# Patient Record
Sex: Female | Born: 1960 | Race: White | Hispanic: No | Smoking: Former smoker
Health system: Southern US, Community
[De-identification: ages and names within clinical notes are randomized; demographics above are authoritative.]

## PROBLEM LIST (undated history)

## (undated) DIAGNOSIS — I10 Essential (primary) hypertension: Secondary | ICD-10-CM

## (undated) DIAGNOSIS — E785 Hyperlipidemia, unspecified: Secondary | ICD-10-CM

## (undated) DIAGNOSIS — J309 Allergic rhinitis, unspecified: Secondary | ICD-10-CM

## (undated) DIAGNOSIS — B20 Human immunodeficiency virus [HIV] disease: Secondary | ICD-10-CM

## (undated) HISTORY — DX: Hyperlipidemia, unspecified: E78.5

## (undated) HISTORY — DX: Human immunodeficiency virus (HIV) disease: B20

## (undated) HISTORY — DX: Allergic rhinitis, unspecified: J30.9

## (undated) HISTORY — DX: Essential (primary) hypertension: I10

---

## 1994-02-28 HISTORY — PX: INCISION AND DRAINAGE PERIRECTAL ABSCESS: SHX1804

## 2002-06-29 HISTORY — PX: CHOLECYSTECTOMY: SHX55

## 2002-11-26 ENCOUNTER — Emergency Department (HOSPITAL_COMMUNITY): Admission: EM | Admit: 2002-11-26 | Discharge: 2002-11-26 | Payer: Self-pay | Admitting: Emergency Medicine

## 2002-11-26 ENCOUNTER — Encounter: Payer: Self-pay | Admitting: Emergency Medicine

## 2004-04-21 ENCOUNTER — Ambulatory Visit (HOSPITAL_COMMUNITY): Admission: RE | Admit: 2004-04-21 | Discharge: 2004-04-21 | Payer: Self-pay | Admitting: Internal Medicine

## 2004-04-21 ENCOUNTER — Encounter (INDEPENDENT_AMBULATORY_CARE_PROVIDER_SITE_OTHER): Payer: Self-pay | Admitting: *Deleted

## 2004-04-21 ENCOUNTER — Ambulatory Visit: Payer: Self-pay | Admitting: Internal Medicine

## 2004-05-18 ENCOUNTER — Ambulatory Visit: Payer: Self-pay | Admitting: Internal Medicine

## 2004-06-29 ENCOUNTER — Ambulatory Visit (HOSPITAL_COMMUNITY): Admission: RE | Admit: 2004-06-29 | Discharge: 2004-06-29 | Payer: Self-pay | Admitting: Internal Medicine

## 2004-06-29 ENCOUNTER — Ambulatory Visit: Payer: Self-pay | Admitting: Internal Medicine

## 2004-07-20 ENCOUNTER — Ambulatory Visit: Payer: Self-pay | Admitting: Internal Medicine

## 2004-10-19 ENCOUNTER — Ambulatory Visit: Payer: Self-pay | Admitting: Internal Medicine

## 2004-10-19 ENCOUNTER — Ambulatory Visit (HOSPITAL_COMMUNITY): Admission: RE | Admit: 2004-10-19 | Discharge: 2004-10-19 | Payer: Self-pay | Admitting: Internal Medicine

## 2005-02-08 ENCOUNTER — Ambulatory Visit: Payer: Self-pay | Admitting: Internal Medicine

## 2005-04-27 ENCOUNTER — Encounter (INDEPENDENT_AMBULATORY_CARE_PROVIDER_SITE_OTHER): Payer: Self-pay | Admitting: *Deleted

## 2005-04-27 ENCOUNTER — Encounter: Admission: RE | Admit: 2005-04-27 | Discharge: 2005-04-27 | Payer: Self-pay | Admitting: Internal Medicine

## 2005-04-27 ENCOUNTER — Ambulatory Visit: Payer: Self-pay | Admitting: Internal Medicine

## 2005-04-27 LAB — CONVERTED CEMR LAB: HIV 1 RNA Quant: 399 copies/mL

## 2005-07-12 ENCOUNTER — Encounter: Admission: RE | Admit: 2005-07-12 | Discharge: 2005-07-12 | Payer: Self-pay | Admitting: Internal Medicine

## 2005-07-12 ENCOUNTER — Encounter (INDEPENDENT_AMBULATORY_CARE_PROVIDER_SITE_OTHER): Payer: Self-pay | Admitting: *Deleted

## 2005-07-12 ENCOUNTER — Ambulatory Visit: Payer: Self-pay | Admitting: Internal Medicine

## 2005-07-12 LAB — CONVERTED CEMR LAB: CD4 Count: 180 microliters

## 2005-07-22 ENCOUNTER — Ambulatory Visit: Payer: Self-pay | Admitting: Gynecology

## 2005-08-12 ENCOUNTER — Ambulatory Visit (HOSPITAL_COMMUNITY): Admission: RE | Admit: 2005-08-12 | Discharge: 2005-08-12 | Payer: Self-pay | Admitting: Internal Medicine

## 2005-08-12 ENCOUNTER — Ambulatory Visit: Payer: Self-pay | Admitting: Family Medicine

## 2005-08-12 ENCOUNTER — Encounter: Payer: Self-pay | Admitting: Family Medicine

## 2005-08-12 ENCOUNTER — Encounter: Payer: Self-pay | Admitting: Internal Medicine

## 2005-12-31 DIAGNOSIS — J309 Allergic rhinitis, unspecified: Secondary | ICD-10-CM | POA: Insufficient documentation

## 2005-12-31 DIAGNOSIS — E785 Hyperlipidemia, unspecified: Secondary | ICD-10-CM

## 2005-12-31 DIAGNOSIS — Z21 Asymptomatic human immunodeficiency virus [HIV] infection status: Secondary | ICD-10-CM | POA: Insufficient documentation

## 2005-12-31 DIAGNOSIS — D649 Anemia, unspecified: Secondary | ICD-10-CM | POA: Insufficient documentation

## 2005-12-31 DIAGNOSIS — E781 Pure hyperglyceridemia: Secondary | ICD-10-CM | POA: Insufficient documentation

## 2005-12-31 DIAGNOSIS — B20 Human immunodeficiency virus [HIV] disease: Secondary | ICD-10-CM

## 2005-12-31 DIAGNOSIS — I10 Essential (primary) hypertension: Secondary | ICD-10-CM | POA: Insufficient documentation

## 2005-12-31 DIAGNOSIS — B360 Pityriasis versicolor: Secondary | ICD-10-CM | POA: Insufficient documentation

## 2005-12-31 HISTORY — DX: Human immunodeficiency virus (HIV) disease: B20

## 2005-12-31 HISTORY — DX: Allergic rhinitis, unspecified: J30.9

## 2005-12-31 HISTORY — DX: Hyperlipidemia, unspecified: E78.5

## 2005-12-31 HISTORY — DX: Essential (primary) hypertension: I10

## 2006-02-14 ENCOUNTER — Encounter: Admission: RE | Admit: 2006-02-14 | Discharge: 2006-02-14 | Payer: Self-pay | Admitting: Internal Medicine

## 2006-02-14 ENCOUNTER — Ambulatory Visit: Payer: Self-pay | Admitting: Internal Medicine

## 2006-02-14 ENCOUNTER — Encounter (INDEPENDENT_AMBULATORY_CARE_PROVIDER_SITE_OTHER): Payer: Self-pay | Admitting: *Deleted

## 2006-02-14 LAB — CONVERTED CEMR LAB
CD4 Count: 210 microliters
HIV 1 RNA Quant: 97 copies/mL

## 2006-04-24 ENCOUNTER — Encounter (INDEPENDENT_AMBULATORY_CARE_PROVIDER_SITE_OTHER): Payer: Self-pay | Admitting: *Deleted

## 2006-04-24 LAB — CONVERTED CEMR LAB

## 2006-05-07 ENCOUNTER — Encounter (INDEPENDENT_AMBULATORY_CARE_PROVIDER_SITE_OTHER): Payer: Self-pay | Admitting: *Deleted

## 2006-07-05 ENCOUNTER — Telehealth: Payer: Self-pay | Admitting: Internal Medicine

## 2006-09-04 ENCOUNTER — Encounter (INDEPENDENT_AMBULATORY_CARE_PROVIDER_SITE_OTHER): Payer: Self-pay | Admitting: *Deleted

## 2006-10-02 ENCOUNTER — Encounter (INDEPENDENT_AMBULATORY_CARE_PROVIDER_SITE_OTHER): Payer: Self-pay | Admitting: *Deleted

## 2006-10-03 ENCOUNTER — Encounter (INDEPENDENT_AMBULATORY_CARE_PROVIDER_SITE_OTHER): Payer: Self-pay | Admitting: *Deleted

## 2006-10-03 ENCOUNTER — Encounter: Admission: RE | Admit: 2006-10-03 | Discharge: 2006-10-03 | Payer: Self-pay | Admitting: Internal Medicine

## 2006-10-03 ENCOUNTER — Ambulatory Visit: Payer: Self-pay | Admitting: Internal Medicine

## 2006-10-03 LAB — CONVERTED CEMR LAB
ALT: 12 units/L (ref 0–35)
Albumin: 4.5 g/dL (ref 3.5–5.2)
Alkaline Phosphatase: 64 units/L (ref 39–117)
Glucose, Bld: 74 mg/dL (ref 70–99)
HIV 1 RNA Quant: 294 copies/mL — ABNORMAL HIGH (ref <50)
HIV-1 RNA Quant, Log: 2.47 — ABNORMAL HIGH (ref <1.70)
MCHC: 33.3 g/dL (ref 30.0–36.0)
MCV: 110.9 fL — ABNORMAL HIGH (ref 78.0–100.0)
Platelets: 383 10*3/uL (ref 150–400)
Potassium: 5.2 meq/L (ref 3.5–5.3)
RBC: 3.57 M/uL — ABNORMAL LOW (ref 3.87–5.11)
Sodium: 135 meq/L (ref 135–145)
Total Bilirubin: 0.6 mg/dL (ref 0.3–1.2)
Total Protein: 7.6 g/dL (ref 6.0–8.3)
WBC: 7 10*3/uL (ref 4.0–10.5)

## 2006-11-09 ENCOUNTER — Telehealth: Payer: Self-pay | Admitting: Internal Medicine

## 2007-03-12 ENCOUNTER — Encounter: Payer: Self-pay | Admitting: Internal Medicine

## 2007-04-03 ENCOUNTER — Encounter (INDEPENDENT_AMBULATORY_CARE_PROVIDER_SITE_OTHER): Payer: Self-pay | Admitting: *Deleted

## 2007-04-03 ENCOUNTER — Ambulatory Visit: Payer: Self-pay | Admitting: Internal Medicine

## 2007-04-03 ENCOUNTER — Encounter: Admission: RE | Admit: 2007-04-03 | Discharge: 2007-04-03 | Payer: Self-pay | Admitting: Internal Medicine

## 2007-04-03 LAB — CONVERTED CEMR LAB
ALT: 16 units/L (ref 0–35)
AST: 17 units/L (ref 0–37)
Albumin: 4.3 g/dL (ref 3.5–5.2)
CO2: 21 meq/L (ref 19–32)
Calcium: 8.8 mg/dL (ref 8.4–10.5)
Chloride: 101 meq/L (ref 96–112)
Cholesterol: 222 mg/dL — ABNORMAL HIGH (ref 0–200)
Creatinine, Ser: 0.7 mg/dL (ref 0.40–1.20)
HIV 1 RNA Quant: 633 copies/mL — ABNORMAL HIGH (ref <50)
HIV-1 RNA Quant, Log: 2.8 — ABNORMAL HIGH (ref <1.70)
Platelets: 354 10*3/uL (ref 150–400)
Potassium: 4.3 meq/L (ref 3.5–5.3)
RDW: 14.3 % (ref 11.5–15.5)
Sodium: 135 meq/L (ref 135–145)
Total CHOL/HDL Ratio: 5.4
Total Protein: 7.2 g/dL (ref 6.0–8.3)
WBC: 9.4 10*3/uL (ref 4.0–10.5)

## 2007-07-09 ENCOUNTER — Telehealth (INDEPENDENT_AMBULATORY_CARE_PROVIDER_SITE_OTHER): Payer: Self-pay | Admitting: *Deleted

## 2007-07-17 ENCOUNTER — Encounter: Payer: Self-pay | Admitting: Internal Medicine

## 2007-10-09 ENCOUNTER — Encounter: Admission: RE | Admit: 2007-10-09 | Discharge: 2007-10-09 | Payer: Self-pay | Admitting: Internal Medicine

## 2007-10-09 ENCOUNTER — Ambulatory Visit: Payer: Self-pay | Admitting: Internal Medicine

## 2007-10-09 ENCOUNTER — Encounter (INDEPENDENT_AMBULATORY_CARE_PROVIDER_SITE_OTHER): Payer: Self-pay | Admitting: *Deleted

## 2007-10-09 LAB — CONVERTED CEMR LAB
AST: 16 units/L (ref 0–37)
Albumin: 4.5 g/dL (ref 3.5–5.2)
Alkaline Phosphatase: 70 units/L (ref 39–117)
Basophils Relative: 0 % (ref 0–1)
Chloride: 101 meq/L (ref 96–112)
Eosinophils Absolute: 0.1 10*3/uL (ref 0.0–0.7)
Glucose, Bld: 85 mg/dL (ref 70–99)
Lymphs Abs: 2.8 10*3/uL (ref 0.7–4.0)
MCV: 108.6 fL — ABNORMAL HIGH (ref 78.0–100.0)
Neutro Abs: 3.8 10*3/uL (ref 1.7–7.7)
Neutrophils Relative %: 52 % (ref 43–77)
Platelets: 378 10*3/uL (ref 150–400)
Potassium: 4.4 meq/L (ref 3.5–5.3)
Sodium: 136 meq/L (ref 135–145)
Total Protein: 7.7 g/dL (ref 6.0–8.3)
WBC: 7.3 10*3/uL (ref 4.0–10.5)

## 2007-10-19 ENCOUNTER — Telehealth: Payer: Self-pay | Admitting: Internal Medicine

## 2007-12-05 ENCOUNTER — Ambulatory Visit: Payer: Self-pay | Admitting: Internal Medicine

## 2008-02-29 DIAGNOSIS — E559 Vitamin D deficiency, unspecified: Secondary | ICD-10-CM

## 2008-02-29 HISTORY — DX: Vitamin D deficiency, unspecified: E55.9

## 2008-05-06 ENCOUNTER — Encounter: Payer: Self-pay | Admitting: Internal Medicine

## 2008-05-20 ENCOUNTER — Telehealth (INDEPENDENT_AMBULATORY_CARE_PROVIDER_SITE_OTHER): Payer: Self-pay | Admitting: *Deleted

## 2008-06-23 ENCOUNTER — Encounter: Payer: Self-pay | Admitting: Internal Medicine

## 2008-07-11 ENCOUNTER — Encounter (INDEPENDENT_AMBULATORY_CARE_PROVIDER_SITE_OTHER): Payer: Self-pay | Admitting: *Deleted

## 2008-07-31 ENCOUNTER — Ambulatory Visit: Payer: Self-pay | Admitting: Internal Medicine

## 2008-07-31 DIAGNOSIS — E881 Lipodystrophy, not elsewhere classified: Secondary | ICD-10-CM

## 2008-07-31 DIAGNOSIS — R197 Diarrhea, unspecified: Secondary | ICD-10-CM

## 2008-07-31 LAB — CONVERTED CEMR LAB
BUN: 8 mg/dL (ref 6–23)
CO2: 20 meq/L (ref 19–32)
Creatinine, Ser: 0.7 mg/dL (ref 0.40–1.20)
GFR calc Af Amer: >60 mL/min (ref >60)
GFR calc non Af Amer: >60 mL/min (ref >60)
Glucose, Bld: 112 mg/dL — ABNORMAL HIGH (ref 70–99)
HDL: 51 mg/dL (ref >39)
HIV 1 RNA Quant: 171 copies/mL — ABNORMAL HIGH (ref <48)
Hemoglobin: 12.7 g/dL (ref 12.0–15.0)
Lymphocytes Relative: 33 % (ref 12–46)
Monocytes Absolute: 0.7 10*3/uL (ref 0.1–1.0)
Monocytes Relative: 14 % — ABNORMAL HIGH (ref 3–12)
Neutro Abs: 2.6 10*3/uL (ref 1.7–7.7)
RBC: 3.46 M/uL — ABNORMAL LOW (ref 3.87–5.11)
RDW: 13.6 % (ref 11.5–15.5)
Total Bilirubin: 0.4 mg/dL (ref 0.3–1.2)
Total CHOL/HDL Ratio: 4.5
WBC: 5 10*3/uL (ref 4.0–10.5)

## 2008-08-01 ENCOUNTER — Telehealth: Payer: Self-pay

## 2008-10-02 ENCOUNTER — Encounter (INDEPENDENT_AMBULATORY_CARE_PROVIDER_SITE_OTHER): Payer: Self-pay | Admitting: *Deleted

## 2008-10-02 ENCOUNTER — Other Ambulatory Visit: Admission: RE | Admit: 2008-10-02 | Discharge: 2008-10-02 | Payer: Self-pay | Admitting: Family Medicine

## 2008-10-02 ENCOUNTER — Encounter: Payer: Self-pay | Admitting: Internal Medicine

## 2008-10-02 LAB — CONVERTED CEMR LAB: Pap Smear: NEGATIVE

## 2008-10-13 ENCOUNTER — Encounter: Admission: RE | Admit: 2008-10-13 | Discharge: 2008-10-13 | Payer: Self-pay | Admitting: Family Medicine

## 2008-10-13 ENCOUNTER — Encounter: Payer: Self-pay | Admitting: Internal Medicine

## 2008-10-29 ENCOUNTER — Encounter: Payer: Self-pay | Admitting: Internal Medicine

## 2008-10-30 ENCOUNTER — Encounter: Payer: Self-pay | Admitting: Internal Medicine

## 2008-10-30 LAB — HM COLONOSCOPY

## 2008-11-05 ENCOUNTER — Encounter: Payer: Self-pay | Admitting: Internal Medicine

## 2008-11-27 ENCOUNTER — Encounter (INDEPENDENT_AMBULATORY_CARE_PROVIDER_SITE_OTHER): Payer: Self-pay | Admitting: *Deleted

## 2009-07-24 ENCOUNTER — Telehealth (INDEPENDENT_AMBULATORY_CARE_PROVIDER_SITE_OTHER): Payer: Self-pay | Admitting: *Deleted

## 2009-08-26 ENCOUNTER — Ambulatory Visit: Payer: Self-pay | Admitting: Internal Medicine

## 2009-08-26 LAB — CONVERTED CEMR LAB
ALT: <8 units/L (ref 0–35)
AST: 11 units/L (ref 0–37)
Alkaline Phosphatase: 90 units/L (ref 39–117)
BUN: 8 mg/dL (ref 6–23)
Basophils Absolute: 0 10*3/uL (ref 0.0–0.1)
Calcium: 9.3 mg/dL (ref 8.4–10.5)
Creatinine, Ser: 0.69 mg/dL (ref 0.40–1.20)
Eosinophils Absolute: 0.2 10*3/uL (ref 0.0–0.7)
Eosinophils Relative: 2 % (ref 0–5)
HCT: 37.2 % (ref 36.0–46.0)
HDL: 43 mg/dL (ref >39)
LDL Cholesterol: 64 mg/dL (ref 0–99)
MCV: 106 fL — ABNORMAL HIGH (ref 78.0–100.0)
Neutrophils Relative %: 64 % (ref 43–77)
Platelets: 471 10*3/uL — ABNORMAL HIGH (ref 150–400)
RDW: 14.4 % (ref 11.5–15.5)
Total Bilirubin: 0.4 mg/dL (ref 0.3–1.2)
Total CHOL/HDL Ratio: 4.3
VLDL: 77 mg/dL — ABNORMAL HIGH (ref 0–40)
WBC: 10.4 10*3/uL (ref 4.0–10.5)

## 2009-09-10 ENCOUNTER — Ambulatory Visit: Payer: Self-pay | Admitting: Internal Medicine

## 2009-09-10 DIAGNOSIS — G5600 Carpal tunnel syndrome, unspecified upper limb: Secondary | ICD-10-CM | POA: Insufficient documentation

## 2009-09-10 DIAGNOSIS — R109 Unspecified abdominal pain: Secondary | ICD-10-CM | POA: Insufficient documentation

## 2009-12-29 ENCOUNTER — Encounter (INDEPENDENT_AMBULATORY_CARE_PROVIDER_SITE_OTHER): Payer: Self-pay | Admitting: *Deleted

## 2010-03-28 LAB — CONVERTED CEMR LAB
ALT: 8 units/L (ref 0–35)
AST: 11 units/L (ref 0–37)
Alkaline Phosphatase: 71 units/L (ref 39–117)
Creatinine, Ser: 0.63 mg/dL (ref 0.40–1.20)
HIV 1 RNA Quant: 97 copies/mL — ABNORMAL HIGH (ref <50)
Total Bilirubin: 0.4 mg/dL (ref 0.3–1.2)

## 2010-03-30 NOTE — Miscellaneous (Signed)
Summary: Orders Update  Clinical Lists Changes  Orders: Added new Test order of T-CBC w/Diff [phone]) - Signed Added new Test order of T-CD4SP [facility]) (CD4SP) - Signed Added new Test order of T-Comprehensive Metabolic Panel [phone]) - Signed Added new Test order of T-HIV Viral Load [phone]) - Signed Added new Test order of T-RPR (Syphilis) ([phone]) - Signed Added new Test order of T-Lipid Profile ([id number]) - Signed     Process Orders Check Orders Results:     [facility]: ABN not required for this insurance Order queued for requisitioning for [facility]: August 26, 2009 9:44 AM  Tests Sent for requisitioning (August 26, 2009 9:44 AM):     08/26/2009: [facility] -- T-CBC w/Diff [[id number]] (signed)     08/26/2009: [facility] -- T-Comprehensive Metabolic Panel [[id number]] (signed)     08/26/2009: [facility] -- T-HIV Viral Load [phone] (signed)     08/26/2009: [facility] -- T-RPR (Syphilis) [phone] (signed)     08/26/2009: [facility] -- T-Lipid Profile [phone] (signed)

## 2010-03-30 NOTE — Assessment & Plan Note (Signed)
Summary: F/U OV/VS   CC:  follow-up visit, left sided rib and lower back and lower abdominal pain, heating pad and bowel movements help with pain, and wakes up in the morning with the pain.  History of Present Illness: Tammy Henson is in for her routine visit.  She says that she has had some difficulty meeting her co-pays for her medications in the last 6 months.  She recalls two times when she had to stop taking her HIV regimen when she could not afford the refills for several days.  She says she is currently making less money.  She has had several new problems since her last visit.  She developed bilateral wrist and hand pain and saw a local neurologist who diagnosed carpal tunnel syndrome.  She was referred to Dr. Merlyn Lot a hand surgeon who suggested surgery but she says she was reluctant and also could not afford it at the time.  She has been wearing a brace on her right wrist and feels that that has helped.  She had a recent episode of left mid back pain and saw a chiropractor who told her that a " rib head was out of alignment" she did feel better after his treatment.   She says that she was given an antibiotic about two months ago at an urgent care center for an upper respiratory infection but developed some diarrhea after two days and stopped taking the antibiotic.  She got better fairly quickly.  About 4 or 5 days ago she noted the onset of lower, no cramping with mild intermittent nausea.  She says that the cramping pain would wake her from sleep.  She went to sit on a heating pad in her living room.  She has had a little bit of diarrhea and finds that after a bowel movement the cramping pain seems to be relieved.  She has had no fever.   She asked if her lisinopril might be the cause of her  in appearance and prominent veins in her arms.  She says friends off and asked her she works out.  Preventive Screening-Counseling & Management  Alcohol-Tobacco     Alcohol drinks/day: 0     Smoking  Status: never     Year Quit: 29 yrs ago     Passive Smoke Exposure: yes  Caffeine-Diet-Exercise     Caffeine use/day: 2     Does Patient Exercise: no  Hep-HIV-STD-Contraception     HIV Risk: no  Safety-Violence-Falls     Seat Belt Use: 100  Comments: declined condoms      Sexual History:  boyfriend.        Drug Use:  never.     Prior Medication List:  ZERIT 40 MG CAPS (STAVUDINE) Take 1 capsule by mouth two times a day VIREAD 300 MG TABS (TENOFOVIR DISOPROXIL FUMARATE) Take 1 tablet by mouth once a day KALETRA 200-50 MG TABS (LOPINAVIR-RITONAVIR) Take 2 tablet by mouth two times a day NASONEX 50 MCG/ACT  SUSP (MOMETASONE FUROATE) 2 puffs in each nostril daily LISINOPRIL 20 MG  TABS (LISINOPRIL) Take 1 tablet by mouth once a day   Current Allergies (reviewed today): No known allergies  Social History: Sexual History:  boyfriend Drug Use:  never  Additional History Menstrual Status:  perimenopausal  Vital Signs:  Patient profile:   50 year old female Menstrual status:  perimenopausal LMP:     09/07/2009 Height:      63 inches (160.02 cm) Weight:      118  pounds (53.64 kg) BMI:     20.98 Temp:     98.1 degrees F (36.72 degrees C) oral Pulse rate:   94 / minute BP sitting:   107 / 71  (left arm) Cuff size:   regular  Vitals Entered By: Jennet Maduro RN (September 10, 2009 9:12 AM) CC: follow-up visit, left sided rib and lower back and lower abdominal pain, heating pad and bowel movements help with pain,  wakes up in the morning with the pain Pain Assessment Patient in pain? yes     Location: see above Intensity: 5 Type: throbbing Onset of pain  Constant Nutritional Status BMI of 19 -24 = normal Nutritional Status Detail appetite "OK" pain causing some nausea  Have you ever been in a relationship where you felt threatened, hurt or afraid?No   Does patient need assistance? Functional Status Self care Ambulation Normal Comments may have missed a few doses  due to money issues LMP (date): 09/07/2009     Menstrual Status perimenopausal Enter LMP: 09/07/2009 Last PAP Result Negative   Physical Exam  General:  alert and thin.  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 Mouth:  good dentition, pharynx pink and moist, no erythema, and no exudates.   Lungs:  normal breath sounds.  no crackles and no wheezes.   Heart:  normal rate, regular rhythm, and no murmur.  normal rate, regular rhythm, and no murmur.   Abdomen:  soft, non-tender, normal bowel sounds, and no distention.   Skin:  no rashes.   Psych:  normally interactive, good eye contact, not anxious appearing, and not depressed appearing.          Medication Adherence: 09/10/2009   Adherence to medications reviewed with patient. Counseling to provide adequate adherence provided                                Impression & Recommendations:  Problem # 1:  HIV DISEASE (ICD-042) Her infection remains under fairly good control but I am concerned about her recent missed medications.  I will have her talk to our medication assistance coordinator today. Diagnostics Reviewed:  CD4: 360 (08/27/2009)   WBC: 10.4 (08/26/2009)   Hgb: 12.1 (08/26/2009)   HCT: 37.2 (08/26/2009)   Platelets: 471 (08/26/2009) HIV-1 RNA: 78 (08/26/2009)   HBSAg: NO (04/24/2006)  Problem # 2:  ABDOMINAL CRAMPS (ICD-789.00) I have asked her to call within the next week if her abdominal cramps and mild diarrhea persist so we can obtain a stool specimen for Clostridium difficile toxin assay. Orders: Physical Therapy Referral (PT)  Problem # 3:  HYPERTENSION (ICD-401.9) Her blood pressure is under very good control.  I told her that her lipoatrophy  is related to her HIV infection and not her blood pressure medication and I encouraged her to continue it. Her updated medication list for this problem includes:    Lisinopril 20 Mg Tabs (Lisinopril) .Marland Kitchen... Take 1 tablet by  mouth once a day  Orders: Est. Patient Level IV (36644)  Problem # 4:  HYPERLIPIDEMIA (ICD-272.4) Her triglyceride level is improving quite a bit.  She is reluctant to take any more medications and wants to try to address it through dietary modification. Labs Reviewed: SGOT: 11 (08/26/2009)   SGPT: <8 U/L (08/26/2009)   HDL:43 (08/26/2009), 51 (07/31/2008)  LDL:64 (08/26/2009), See Comment mg/dL (03/47/4259)  DGLO:756 (08/26/2009), 231 (07/31/2008)  Trig:383 (08/26/2009), 563 (  07/31/2008)  Problem # 5:  CARPAL TUNNEL SYNDROME (ICD-354.0) I will refer her for physical therapy. Orders: Physical Therapy Referral (PT)  Other Orders: Pneumococcal Vaccine (09811) Admin 1st Vaccine (91478) Admin 1st Vaccine [facility]) [phone]) Future Orders: T-CD4SP ([facility]) (CD4SP) ... 03/09/2010 T-HIV Viral Load [phone]) ... 03/09/2010 T-Comprehensive Metabolic Panel [phone]) ... 03/09/2010 T-CBC w/Diff ([id number]) ... 03/09/2010 T-RPR (Syphilis) [phone]) ... 03/09/2010 T-Lipid Profile [phone]) ... 03/09/2010  Patient Instructions: 1)  Please schedule a follow-up appointment in 6 months.    Pneumovax Vaccine    Vaccine Type: Pneumovax    Site: left deltoid    Mfr: Merck    Dose: 0.5 ml    Route: IM    Given by: Jennet Maduro RN    Exp. Date: 02/27/2011    Lot #: [id number]

## 2010-03-30 NOTE — Miscellaneous (Signed)
Summary: RW flowsheet update  Clinical Lists Changes  Observations: Added new observation of ABNLIPIDS: Yes (12/29/2009 18:27) Added new observation of YEARAIDSPOS: 2006  (12/29/2009 18:27) Added new observation of HIV STATUS: CDC-defined AIDS  (12/29/2009 18:27)

## 2010-03-30 NOTE — Miscellaneous (Signed)
Summary: [facility]   Imported By: Florinda Marker 09/11/2009 15:43:21  _____________________________________________________________________  External Attachment:    Type:   Image     Comment:   External Document

## 2010-03-30 NOTE — Progress Notes (Signed)
Summary: Request for 7 day supply. waiting on mail order  Phone Note Call from Patient   Caller: Patient Reason for Call: Refill Medication Details for Reason: Need 7 days supply of med Summary of Call: Patient's mail order did not arrive.  Patient is asking for a 7 days supply of med be called into pharmacy.  Insurance approved. Initial call taken by: Paulo Fruit  BS,CPht II,MPH,  Jul 24, 2009 4:00 PM    Prescriptions: ZERIT 40 MG CAPS (STAVUDINE) Take 1 capsule by mouth two times a day  #14 x 0   Entered by:   Paulo Fruit  BS,CPht II,MPH   Authorized by:   Cliffton Asters MD   Signed by:   Paulo Fruit  BS,CPht II,MPH on 07/24/2009   Method used:   Electronically to        [facility] (retail)       [address]       [place], [place]  [address]       Ph: [phone] or [phone]       Fax: [phone]   RxID:   [id number]  Paulo Fruit  BS,CPht II,MPH  Jul 24, 2009 4:00 PM

## 2010-03-31 ENCOUNTER — Encounter: Payer: Self-pay | Admitting: *Deleted

## 2010-03-31 LAB — CONVERTED CEMR LAB: Pap Smear: NORMAL

## 2010-04-15 ENCOUNTER — Other Ambulatory Visit (HOSPITAL_COMMUNITY)
Admission: RE | Admit: 2010-04-15 | Discharge: 2010-04-15 | Disposition: A | Discharge status: Home / Self Care | Payer: 59 | Source: Ambulatory Visit | Attending: Family Medicine | Admitting: Family Medicine

## 2010-04-15 ENCOUNTER — Ambulatory Visit (INDEPENDENT_AMBULATORY_CARE_PROVIDER_SITE_OTHER): Payer: 59 | Admitting: Family Medicine

## 2010-04-15 ENCOUNTER — Other Ambulatory Visit: Payer: Self-pay | Admitting: Family Medicine

## 2010-04-15 DIAGNOSIS — I10 Essential (primary) hypertension: Secondary | ICD-10-CM

## 2010-04-15 DIAGNOSIS — Z124 Encounter for screening for malignant neoplasm of cervix: Secondary | ICD-10-CM | POA: Insufficient documentation

## 2010-04-15 DIAGNOSIS — N390 Urinary tract infection, site not specified: Secondary | ICD-10-CM

## 2010-04-15 DIAGNOSIS — Z0000 Encounter for general adult medical examination without abnormal findings: Secondary | ICD-10-CM

## 2010-04-15 DIAGNOSIS — Z23 Encounter for immunization: Secondary | ICD-10-CM

## 2010-05-14 ENCOUNTER — Encounter: Payer: Self-pay | Admitting: Internal Medicine

## 2010-05-14 ENCOUNTER — Encounter: Payer: Self-pay | Admitting: *Deleted

## 2010-05-16 LAB — T-HELPER CELL (CD4) - (RCID CLINIC ONLY): CD4 % Helper T Cell: 14 % — ABNORMAL LOW (ref 33–55)

## 2010-05-18 NOTE — Miscellaneous (Signed)
Summary: Historical PAP results 03/31/10 = normal  Clinical Lists Changes  Observations: Added new observation of PAP SMEAR: NORMAL (03/31/2010 10:22) Added new observation of LAST PAP DAT: [phone]  (03/31/2010 10:22)

## 2010-05-18 NOTE — Miscellaneous (Addendum)
Summary: Orders Update - labwork  Clinical Lists Changes  Orders: Added new Test order of T-CBC w/Diff ([phone]) - Signed Added new Test order of T-CD4SP [facility]) (CD4SP) - Signed Added new Test order of T-Comprehensive Metabolic Panel [phone]) - Signed Added new Test order of T-HIV Viral Load [phone]) - Signed Added new Test order of T-RPR (Syphilis) [phone]) - Signed Added new Test order of T-Lipid Profile ([id number]) - Signed     Process Orders Check Orders Results:     [facility]: ABN not required for this insurance Order queued for requisitioning for [facility]: May 14, 2010 2:36 PM  Tests Sent for requisitioning (May 14, 2010 2:36 PM):     06/01/2010: [facility] -- T-CBC w/Diff [[id number]] (signed)     06/01/2010: [facility] -- T-Comprehensive Metabolic Panel [[id number]] (signed)     06/01/2010: [facility] -- T-HIV Viral Load [phone] (signed)     06/01/2010: [facility] -- T-RPR (Syphilis) [phone] (signed)     06/01/2010: [facility] -- T-Lipid Profile [phone] (signed)

## 2010-06-01 ENCOUNTER — Other Ambulatory Visit (INDEPENDENT_AMBULATORY_CARE_PROVIDER_SITE_OTHER): Payer: 59

## 2010-06-01 DIAGNOSIS — B20 Human immunodeficiency virus [HIV] disease: Secondary | ICD-10-CM

## 2010-06-01 LAB — CBC WITH DIFFERENTIAL/PLATELET
Basophils Absolute: 0 10*3/uL (ref 0.0–0.1)
Basophils Relative: 0 % (ref 0–1)
HCT: 38.7 % (ref 36.0–46.0)
MCHC: 33.3 g/dL (ref 30.0–36.0)
Monocytes Absolute: 0.6 10*3/uL (ref 0.1–1.0)
Neutro Abs: 3.3 10*3/uL (ref 1.7–7.7)
RDW: 13.8 % (ref 11.5–15.5)

## 2010-06-02 LAB — T-HELPER CELL (CD4) - (RCID CLINIC ONLY)
CD4 % Helper T Cell: 16 % — ABNORMAL LOW (ref 33–55)
CD4 T Cell Abs: 530 uL (ref 400–2700)

## 2010-06-02 LAB — COMPLETE METABOLIC PANEL WITH GFR
AST: 20 U/L (ref 0–37)
Albumin: 4.7 g/dL (ref 3.5–5.2)
Alkaline Phosphatase: 94 U/L (ref 39–117)
BUN: 10 mg/dL (ref 6–23)
Creat: 0.85 mg/dL (ref 0.40–1.20)
Potassium: 4.6 mEq/L (ref 3.5–5.3)
Total Bilirubin: 0.5 mg/dL (ref 0.3–1.2)

## 2010-06-03 LAB — HIV-1 RNA QUANT-NO REFLEX-BLD
HIV 1 RNA Quant: <20 copies/mL (ref <20)
HIV-1 RNA Quant, Log: <1.3 {Log} (ref <1.30)

## 2010-06-07 LAB — T-HELPER CELL (CD4) - (RCID CLINIC ONLY)
CD4 % Helper T Cell: 15 % — ABNORMAL LOW (ref 33–55)
CD4 T Cell Abs: 250 uL — ABNORMAL LOW (ref 400–2700)

## 2010-06-15 ENCOUNTER — Encounter: Payer: Self-pay | Admitting: Internal Medicine

## 2010-06-15 ENCOUNTER — Ambulatory Visit (INDEPENDENT_AMBULATORY_CARE_PROVIDER_SITE_OTHER): Payer: 59 | Admitting: Internal Medicine

## 2010-06-15 ENCOUNTER — Encounter: Payer: Self-pay | Admitting: Licensed Clinical Social Worker

## 2010-06-15 VITALS — BP 122/76 | HR 85 | Temp 98.4°F | Ht 63.0 in | Wt 115.5 lb

## 2010-06-15 DIAGNOSIS — B20 Human immunodeficiency virus [HIV] disease: Secondary | ICD-10-CM

## 2010-06-15 DIAGNOSIS — I10 Essential (primary) hypertension: Secondary | ICD-10-CM

## 2010-06-15 NOTE — Assessment & Plan Note (Signed)
Despite some Mrs. of her medications her HIV infection is under better control. Her CD4 count is up to 530 and her viral load is undetectable at less than 20. I will continue her current regimen and I've encouraged her to call us if she is having trouble with her co-pays.

## 2010-06-15 NOTE — Progress Notes (Signed)
  Subjective:    Patient ID: Tammy Henson, female    DOB: 11-02-1960, 50 y.o.   MRN: [id number]  HPI Tammy Henson is in for her routine visit. She is feeling well and has no complaints. She think she is missed 2-3 days of her HIV medicines since her visit 6 months ago. This tends to occur when she must come up with her co-pay for her refills and is short for money. She believes this will improve as she has paid off her car loan. She has started exercising regularly and says she is interested in knowing if she could come off of her blood pressure medication.   Review of Systems     Objective:   Physical Exam  Constitutional: She appears well-developed and well-nourished. No distress.  HENT:  Mouth/Throat: Oropharynx is clear and moist. No oropharyngeal exudate.  Cardiovascular: Normal rate and regular rhythm.   No murmur heard. Pulmonary/Chest: Breath sounds normal. She has no wheezes.  Psychiatric: She has a normal mood and affect.          Assessment & Plan:

## 2010-06-15 NOTE — Assessment & Plan Note (Signed)
Her blood pressure is under good control but I cannot determine if this is because she does not have hypertension or is simply taking her blood pressure medication. I suggested that she discuss this with her primary care physician Dr. Joselyn Arrow.

## 2010-07-16 NOTE — Group Therapy Note (Signed)
Tammy Henson, Tammy Henson NO.:  [id number]   MEDICAL RECORD NO.:  [id number]          PATIENT TYPE:  WOC   LOCATION:  [facility]                   FACILITY:  [facility]   PHYSICIAN:  Tinnie Gens, MD        DATE OF BIRTH:  [birth year]   DATE OF SERVICE:  08/12/2005                                    CLINIC NOTE   CHIEF COMPLAINT:  Pap smear, GC and Chlamydia screening.   SUBJECTIVE:  Tammy Henson is a 50 year old white female patient, HIV-  positive, who comes in today for her Pap smear and STD screening.  The  patient had her yearly examination performed on Jul 24, 2005, but she was  having her period at that moment.  The patient's is asymptomatic currently.   OBJECTIVE:  VITAL SIGNS:  Blood pressure 132/86, pulse 82, respiratory rate  20.  ABDOMEN:  Positive bowel sounds.  Nontender.  Mild distension.  No masses  palpable.  PELVIC:  External genitalia within normal limits.  Speculum examination  shows multiparous cervix with hypertrophy and multiple __________ cysts.  Pap smear, gonorrhea, chlamydia, and wet prep samples taken and sent.  The  patient tolerated the procedure.   ASSESSMENT AND PLAN:  Pap smear, gonorrhea and Chlamydia, and wet prep.           ______________________________  Tinnie Gens, MD     TP/MEDQ  D:  08/12/2005  T:  08/12/2005  Job:  [id number]

## 2010-07-16 NOTE — Group Therapy Note (Signed)
NAMETEMECA, SOMMA NO.:  [id number]   MEDICAL RECORD NO.:  [id number]          PATIENT TYPE:  WOC   LOCATION:  [facility]                   FACILITY:  [facility]   PHYSICIAN:  Ginger Carne, MD DATE OF BIRTH:  [birth year]   DATE OF SERVICE:  07/22/2005                                    CLINIC NOTE   REASON FOR CONSULTATION:  Yearly examination.   HISTORY OF PRESENT ILLNESS:  This patient is a 50 year old Caucasian female,  multiparous, gravida 4, para 4-0-0-4, who presents today with request for  complete physical examination.  She is HIV-positive and is appropriately  treated on Kaletra, Viread, Zerit and lisinopril.  The patient complains of  occasional bloating associated with her menses and cramping but otherwise  has no specific complaints.   Significant past history includes cholecystectomy in 2004 and a perirectal  abscess drained in 1996.   Her father and mother have coronary artery heart disease, hypertension as  well.   ALLERGIES:  None.   A 12-point comprehensive review of systems is unremarkable.   The patient does not use any form of birth control at this time.   PHYSICAL EXAMINATION:  VITAL SIGNS:  Blood pressure is 136/96, weight 109.6  pounds.  Height is 5 feet 3 inches.  HEENT:  Grossly normal.  BREASTS:  Without masses, discharge, thickenings or tenderness.  CHEST:  Clear to percussion and auscultation.  CARDIOVASCULAR:  Without murmurs or enlargements, regular rate and rhythm.  EXTREMITIES, LYMPHATIC, SKIN, NEUROLOGIC, MUSCULOSKELETAL:  Grossly normal.  ABDOMEN:  Soft without gross hepatosplenomegaly.  PELVIC:  The patient is at the end of her menses and Pap smear will be  deferred until menses have stopped.  External genitalia, vulva and vagina  appear normal.  Cervix is smooth without erosions or lesions.  The uterus is  small, anteverted and flexed, and both adnexa are palpable without masses.   IMPRESSION:  Normal gynecological  examination.   PLAN:  The patient is scheduled for a mammogram and will return in the next  week or two to have her Pap smear performed when menses are over.  Hydrochlorothiazide 25 mg was prescribed daily as needed for abdominal  bloating secondary to her menses.  The patient does not need contraception  at this time.           ______________________________  Ginger Carne, MD     SHB/MEDQ  D:  07/22/2005  T:  07/23/2005  Job:  [id number]

## 2010-08-31 IMAGING — US US ABDOMEN COMPLETE
1 series · 14 of 25 positions shown · non-contrast
Comparison: None.

CLINICAL DATA: Abdominal pain and bloating.

COMPLETE ABDOMINAL ULTRASOUND

[Series 1: us abdomen complete · 0.37mm/px · 14 of 91 slices shown]
[im 1/91]
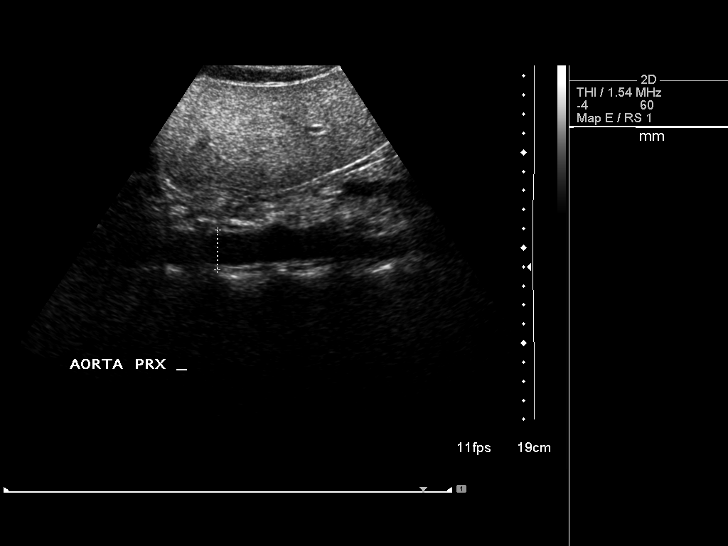
[im 8/91]
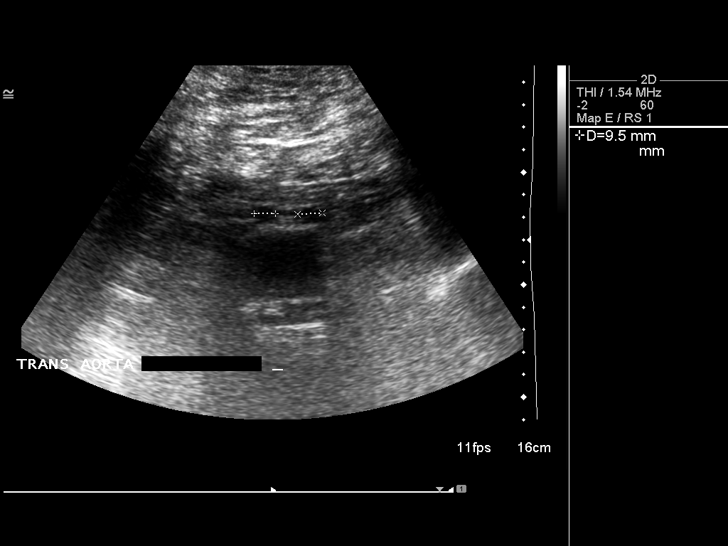
[im 16/91]
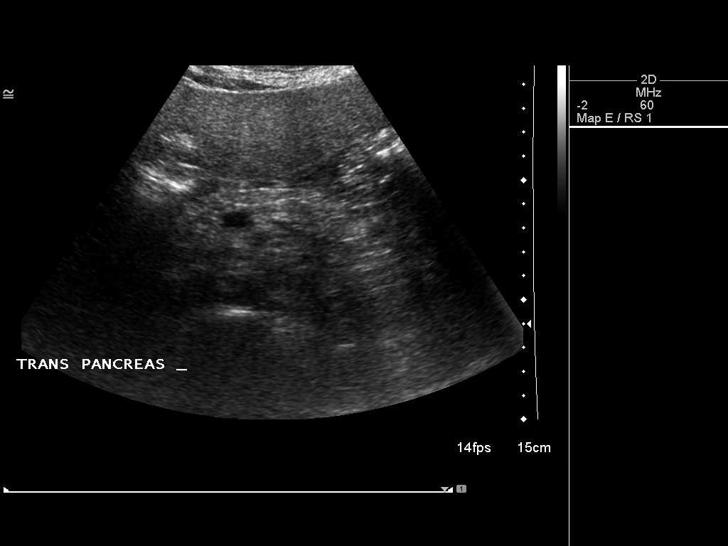
[im 23/91]
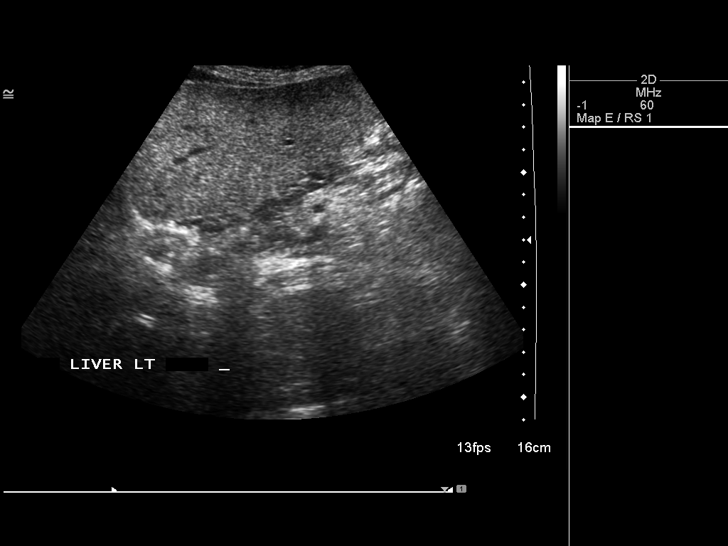
[im 31/91]
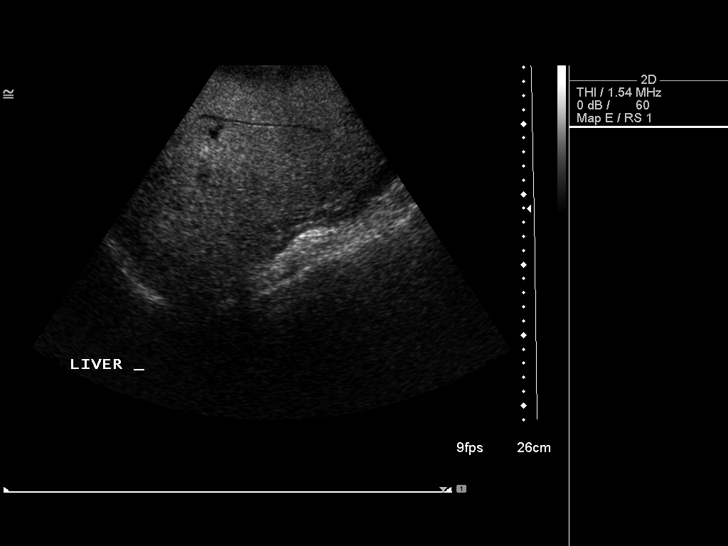
[im 34/91]
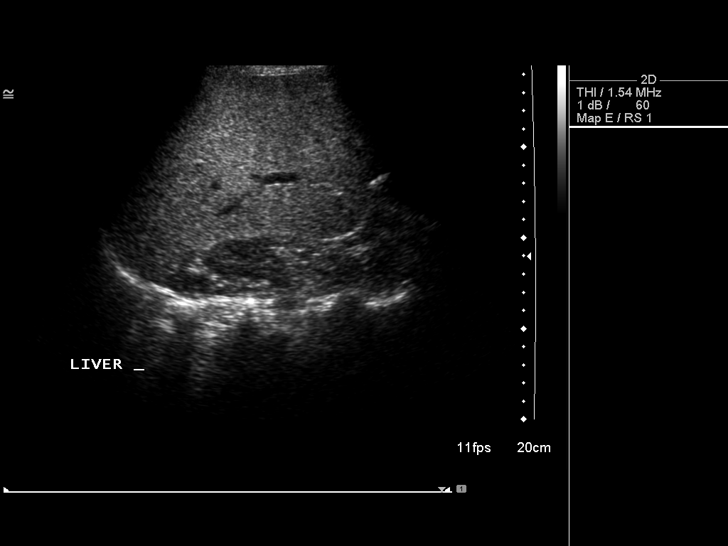
[im 42/91]
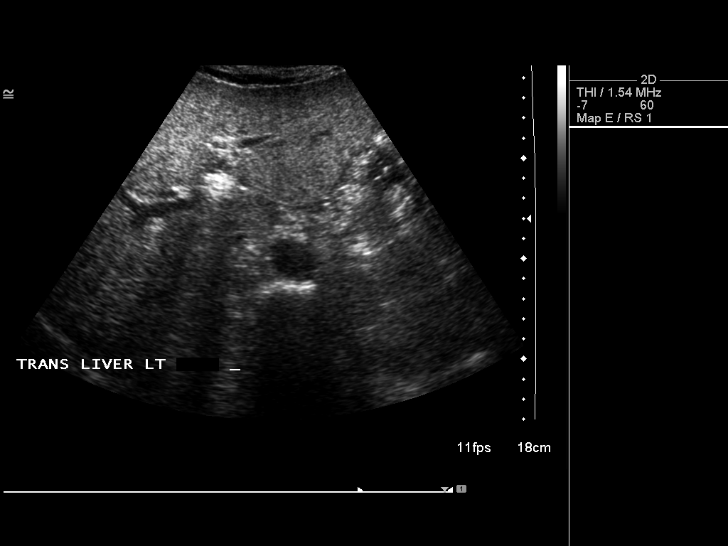
[im 49/91]
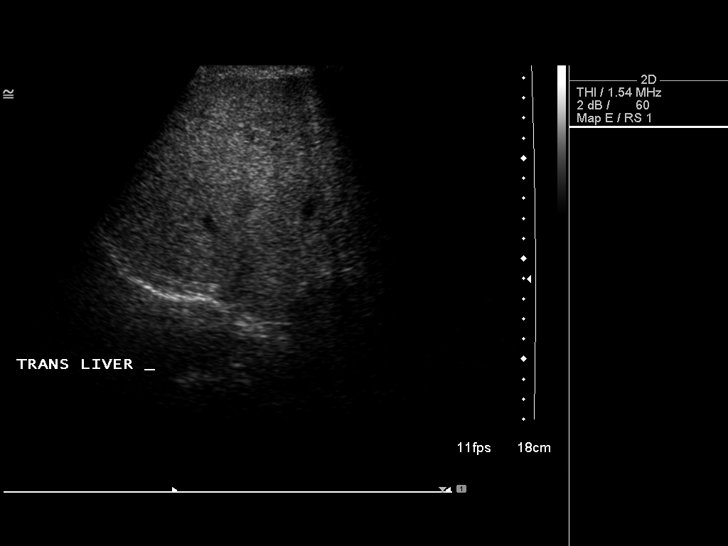
[im 57/91]
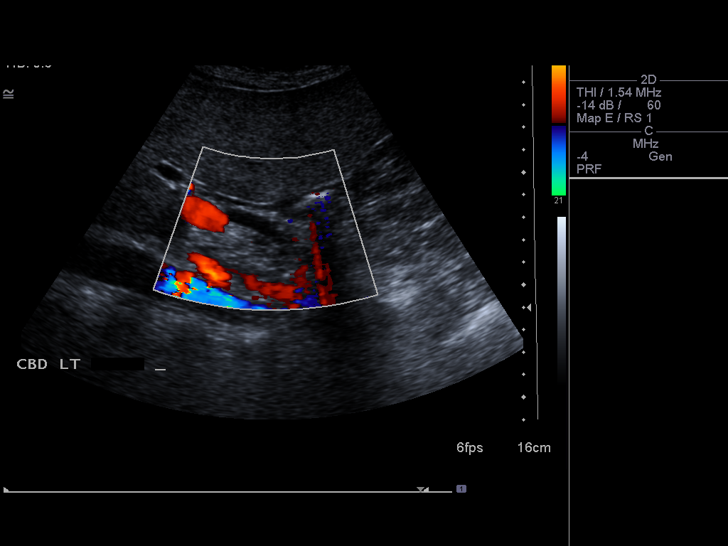
[im 61/91]
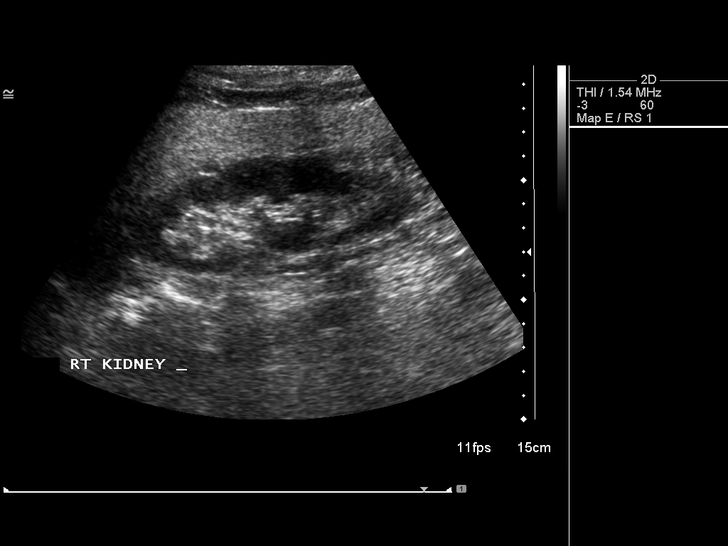
[im 68/91]
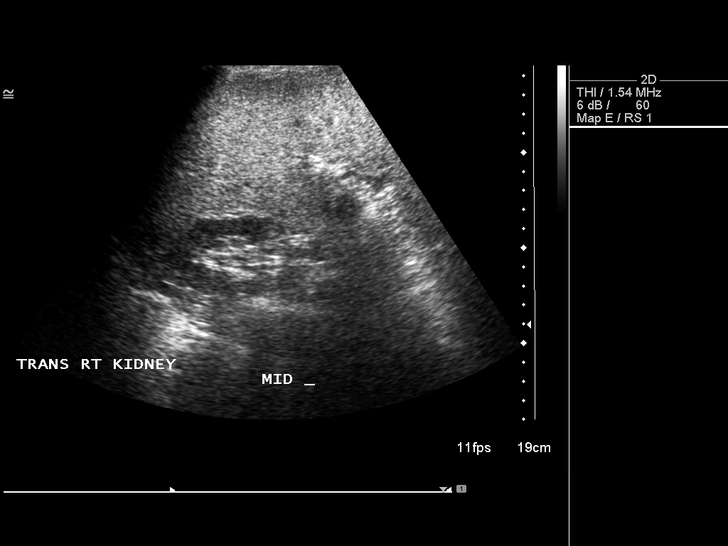
[im 76/91]
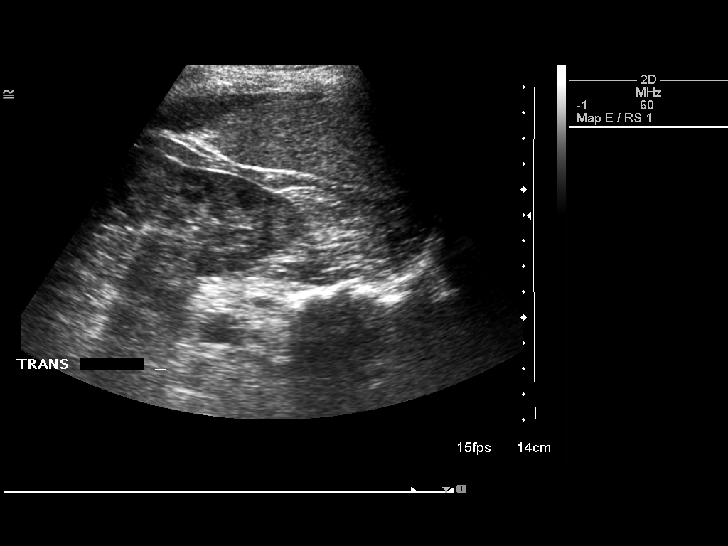
[im 83/91]
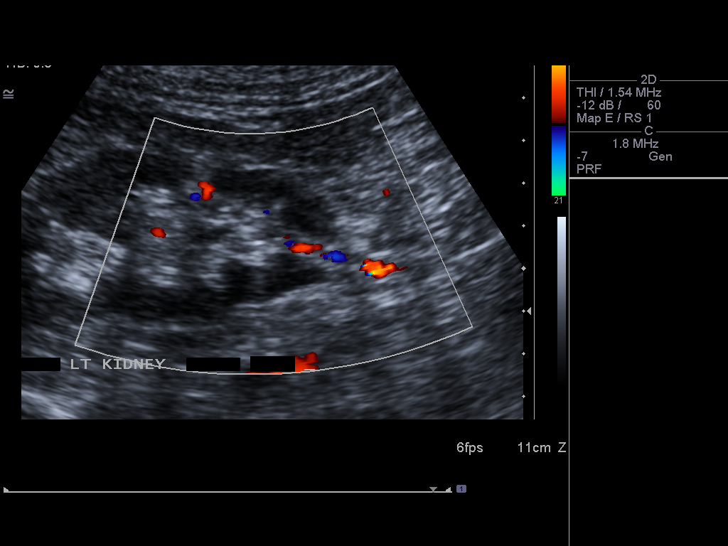
[im 91/91]
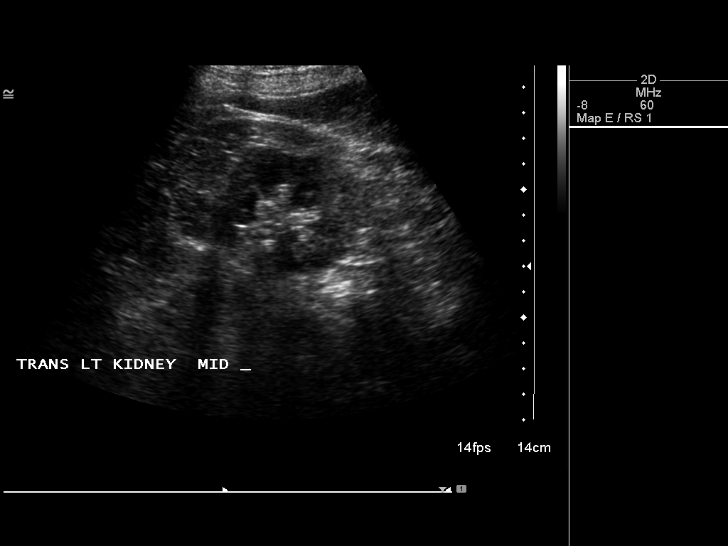

[14 of 25 positions shown; findings below may reference images not displayed]

FINDINGS: Gallbladder:  Surgically absent.

Common bile duct:  7 mm, within normal limits post cholecystectomy.

Liver:  Diffusely increased in echogenicity.  Measures 20.1 cm.

IVC:  Visualized.

Pancreas:  Somewhat suboptimally visualized due to bowel gas.

Spleen:  10.8 cm, negative.

Right Kidney:  12.0 cm, negative.

Left Kidney:  13.2 cm, negative.

Abdominal aorta:  2.1 cm.
IMPRESSION: Fatty enlarged liver.

## 2010-08-31 IMAGING — US US PELVIS COMPLETE
1 series · 14 of 25 positions shown · non-contrast
Comparison: None.

CLINICAL DATA: Abdominal pain and bloating.  Lower abdominal
distention.

TRANSABDOMINAL AND TRANSVAGINAL ULTRASOUND OF PELVIS
TECHNIQUE: Both transabdominal and transvaginal ultrasound
examinations of the pelvis were performed including evaluation of
the uterus, ovaries, adnexal regions, and pelvic cul-de-sac.

[Series 1: us pelvis complete · 0.26mm/px · 14 of 80 slices shown]
[im 1/80]
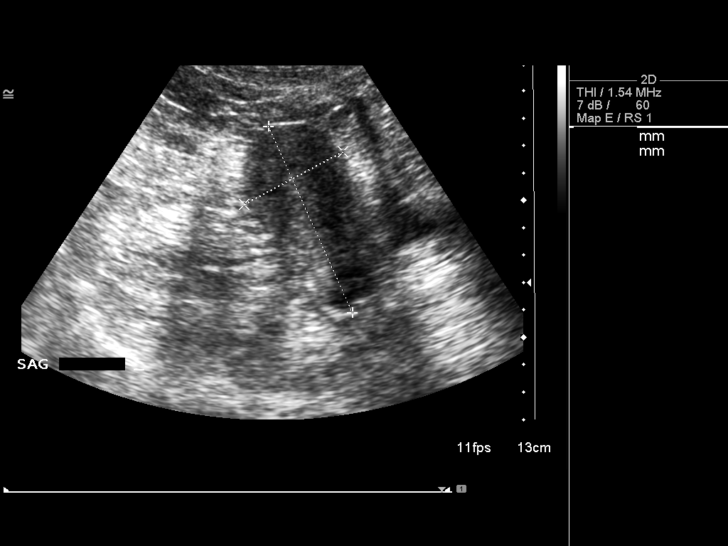
[im 7/80]
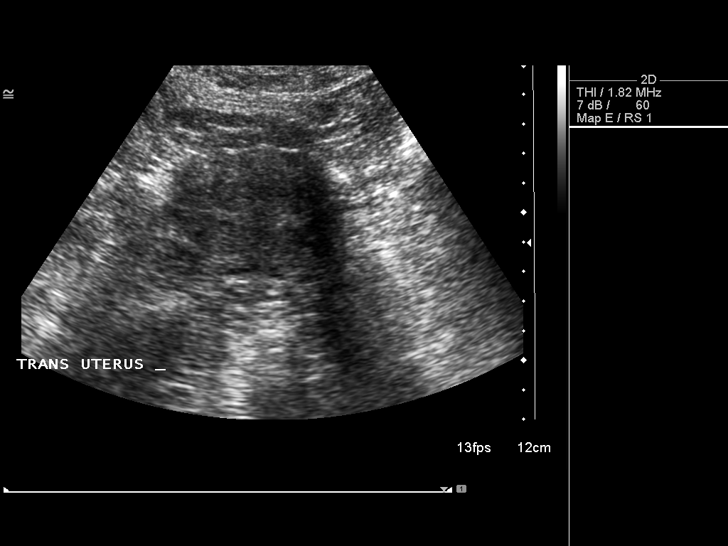
[im 14/80]
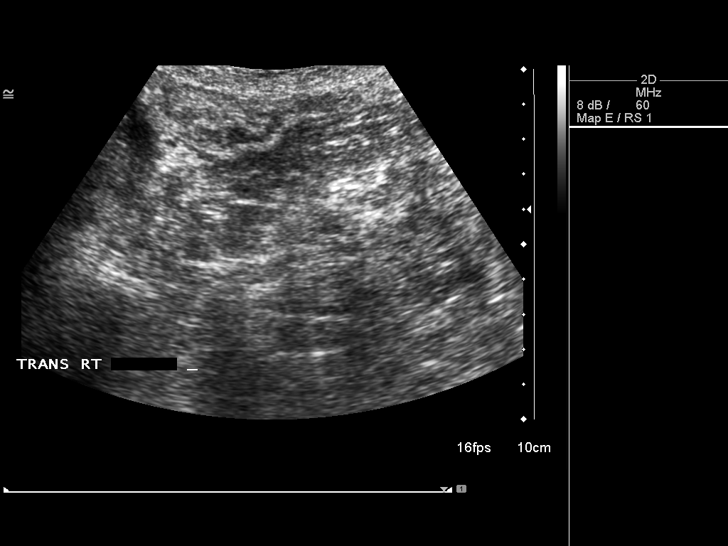
[im 20/80]
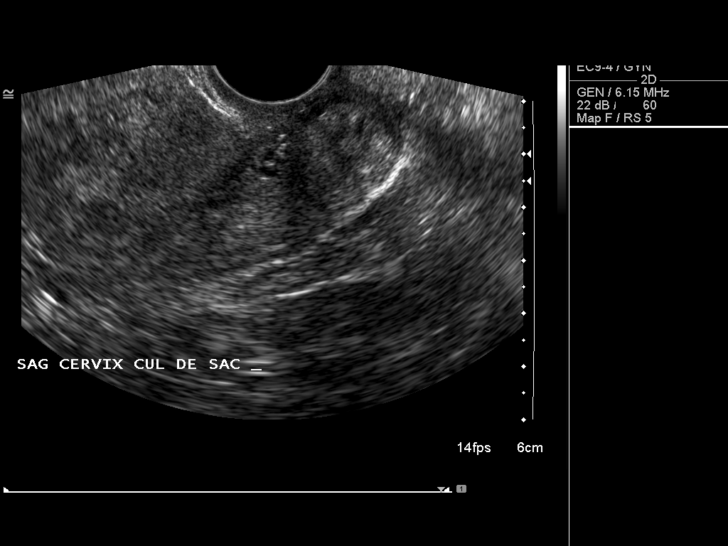
[im 27/80]
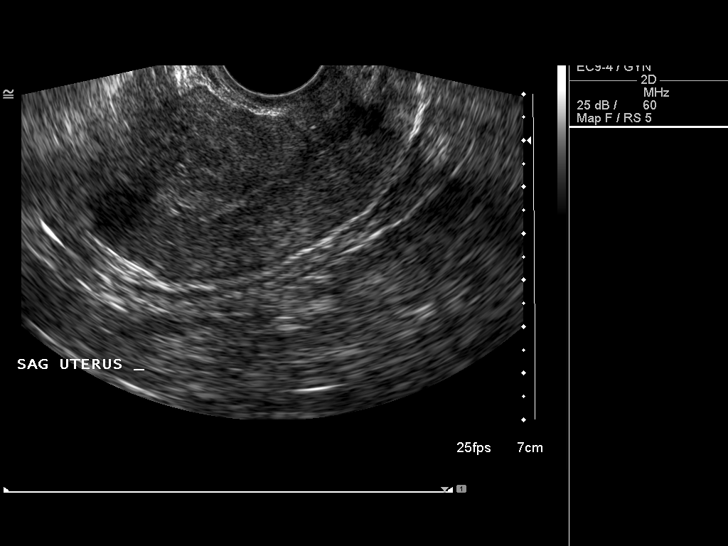
[im 30/80]
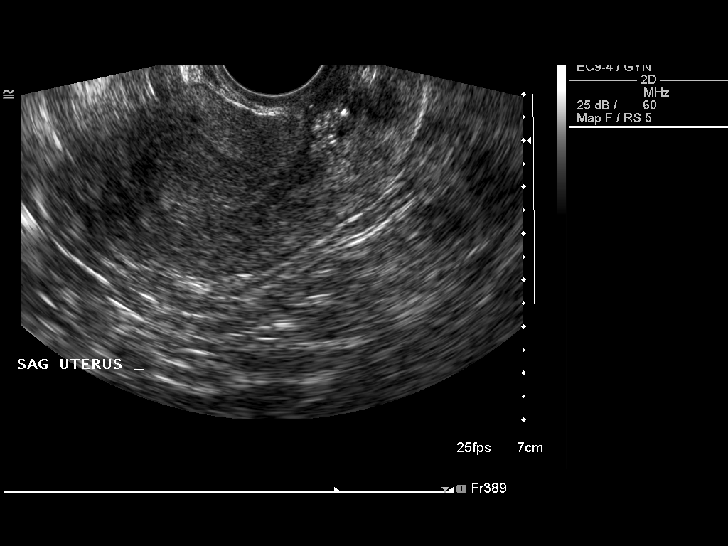
[im 37/80]
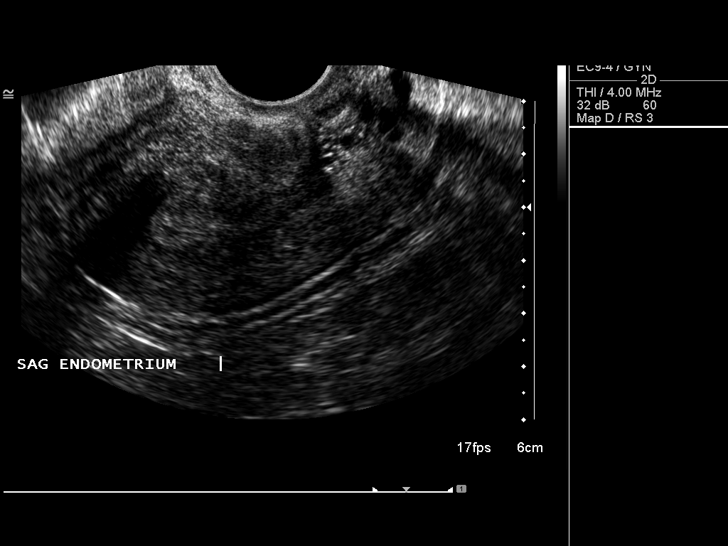
[im 43/80]
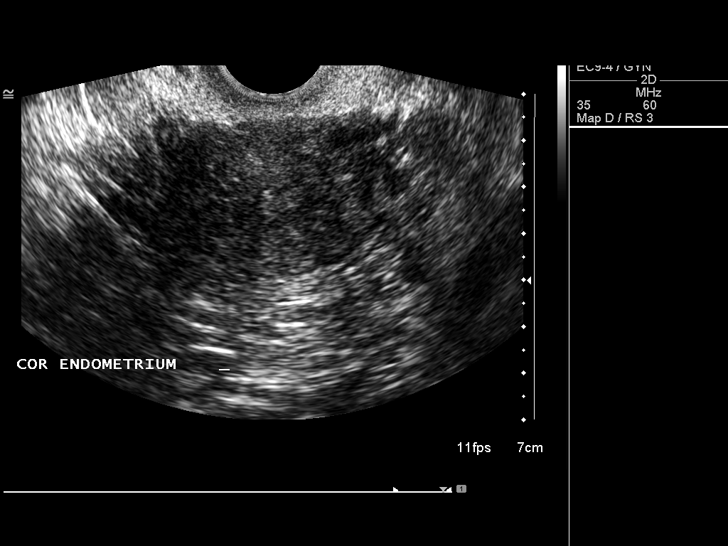
[im 50/80]
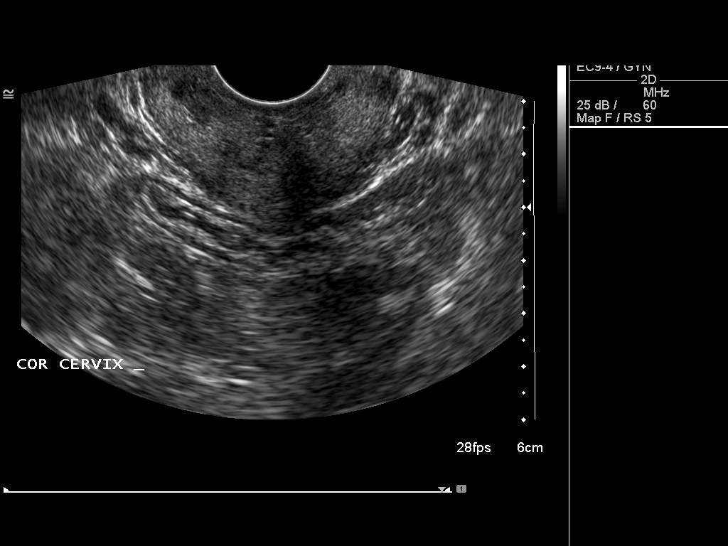
[im 53/80]
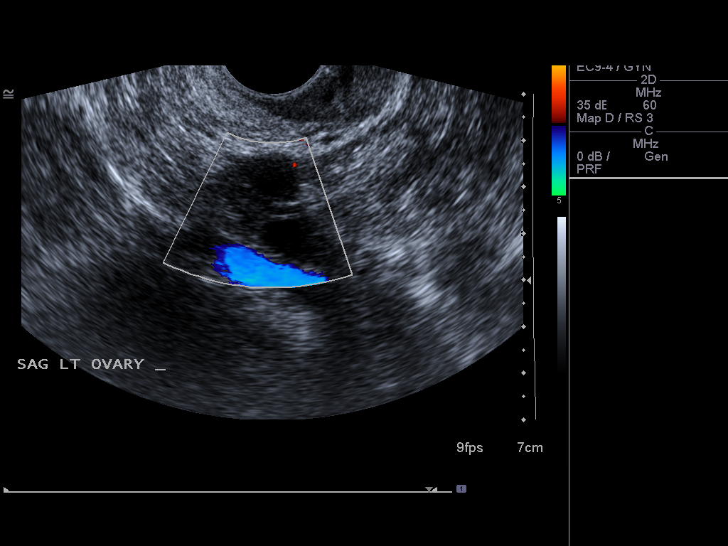
[im 60/80]
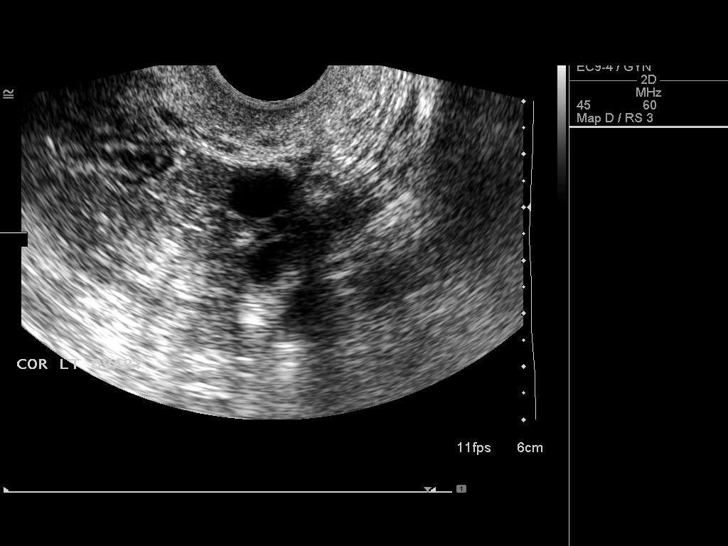
[im 66/80]
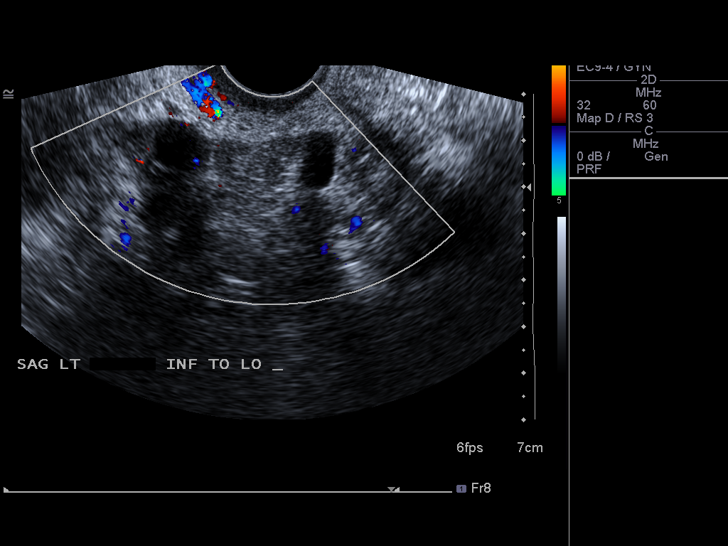
[im 73/80]
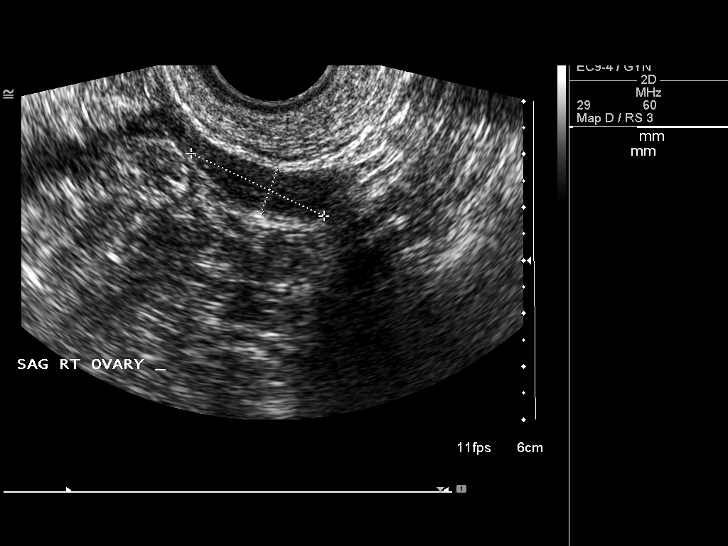
[im 80/80]
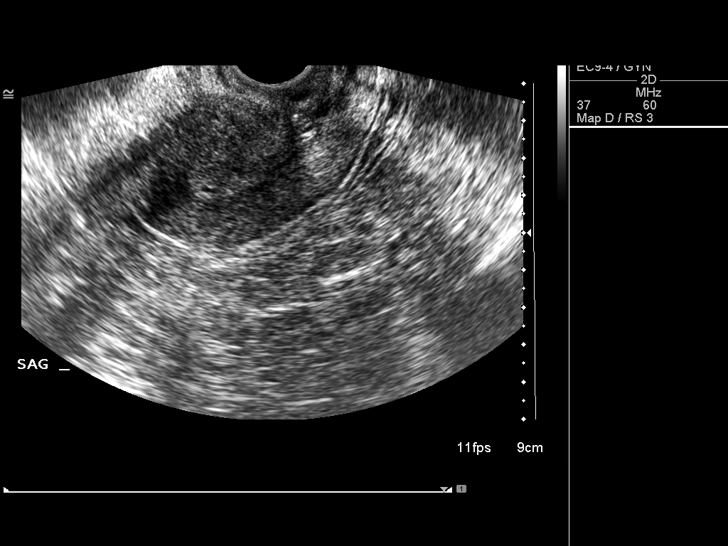

[14 of 25 positions shown; findings below may reference images not displayed]

FINDINGS: Uterus measures 7.6 x 4.2 x 5.8 cm.

Endometrium measures 9 mm in thickness.  

Right Ovary measures 2.8 x 0.9 x 2.6 cm.

Left Ovary measures 3.2 x 2.0 x 1.5 cm.

Other Findings:  No free fluid.  Inferior to the left ovary, a
cystic area measures 0.8 x 1.4 x 1.2 cm, and is simple in
appearance.
IMPRESSION: 1.  No acute findings in the pelvis.
2.  Question small left paraovarian cyst.

## 2010-09-23 ENCOUNTER — Other Ambulatory Visit: Payer: Self-pay | Admitting: Internal Medicine

## 2010-10-12 ENCOUNTER — Other Ambulatory Visit: Payer: Self-pay | Admitting: *Deleted

## 2010-10-12 DIAGNOSIS — B20 Human immunodeficiency virus [HIV] disease: Secondary | ICD-10-CM

## 2010-10-12 MED ORDER — LOPINAVIR-RITONAVIR 200-50 MG PO TABS: 2.0000 | ORAL_TABLET | Freq: Two times a day (BID) | ORAL | Status: DC

## 2010-10-12 MED ORDER — TENOFOVIR DISOPROXIL FUMARATE 300 MG PO TABS: 300.0000 mg | ORAL_TABLET | Freq: Every day | ORAL | Status: DC

## 2010-10-12 MED ORDER — STAVUDINE 40 MG PO CAPS: 40.0000 mg | ORAL_CAPSULE | Freq: Two times a day (BID) | ORAL | Status: DC

## 2010-10-18 ENCOUNTER — Other Ambulatory Visit: Payer: Self-pay | Admitting: Licensed Clinical Social Worker

## 2010-10-18 DIAGNOSIS — B20 Human immunodeficiency virus [HIV] disease: Secondary | ICD-10-CM

## 2010-11-19 LAB — T-HELPER CELL (CD4) - (RCID CLINIC ONLY): CD4 % Helper T Cell: 11 — ABNORMAL LOW

## 2010-11-26 LAB — T-HELPER CELL (CD4) - (RCID CLINIC ONLY)
CD4 % Helper T Cell: 17 — ABNORMAL LOW
CD4 T Cell Abs: 460

## 2010-12-13 LAB — T-HELPER CELL (CD4) - (RCID CLINIC ONLY): CD4 % Helper T Cell: 10 — ABNORMAL LOW

## 2011-01-28 ENCOUNTER — Other Ambulatory Visit: Payer: Self-pay | Admitting: Internal Medicine

## 2011-03-18 ENCOUNTER — Other Ambulatory Visit: Payer: Self-pay | Admitting: Internal Medicine

## 2011-03-18 DIAGNOSIS — B20 Human immunodeficiency virus [HIV] disease: Secondary | ICD-10-CM

## 2011-03-22 ENCOUNTER — Other Ambulatory Visit (INDEPENDENT_AMBULATORY_CARE_PROVIDER_SITE_OTHER): Payer: 59

## 2011-03-22 DIAGNOSIS — B20 Human immunodeficiency virus [HIV] disease: Secondary | ICD-10-CM

## 2011-03-22 DIAGNOSIS — Z21 Asymptomatic human immunodeficiency virus [HIV] infection status: Secondary | ICD-10-CM

## 2011-03-22 LAB — COMPREHENSIVE METABOLIC PANEL
ALT: 12 U/L (ref 0–35)
AST: 14 U/L (ref 0–37)
Albumin: 4.3 g/dL (ref 3.5–5.2)
CO2: 23 mEq/L (ref 19–32)
Calcium: 8.8 mg/dL (ref 8.4–10.5)
Chloride: 103 mEq/L (ref 96–112)
Glucose, Bld: 112 mg/dL — ABNORMAL HIGH (ref 70–99)
Potassium: 4.3 mEq/L (ref 3.5–5.3)
Sodium: 136 mEq/L (ref 135–145)
Total Bilirubin: 0.4 mg/dL (ref 0.3–1.2)
Total Protein: 7.2 g/dL (ref 6.0–8.3)

## 2011-03-22 LAB — LIPID PANEL
Cholesterol: 218 mg/dL — ABNORMAL HIGH (ref 0–200)
HDL: 43 mg/dL (ref >39)

## 2011-03-23 LAB — CBC WITH DIFFERENTIAL/PLATELET
HCT: 39.5 % (ref 36.0–46.0)
Hemoglobin: 13.1 g/dL (ref 12.0–15.0)
Lymphs Abs: 3.2 10*3/uL (ref 0.7–4.0)
Monocytes Relative: 7 % (ref 3–12)
Neutro Abs: 3.8 10*3/uL (ref 1.7–7.7)
Neutrophils Relative %: 51 % (ref 43–77)
RBC: 3.9 MIL/uL (ref 3.87–5.11)

## 2011-03-23 LAB — T-HELPER CELL (CD4) - (RCID CLINIC ONLY)
CD4 % Helper T Cell: 16 % — ABNORMAL LOW (ref 33–55)
CD4 T Cell Abs: 480 uL (ref 400–2700)

## 2011-03-23 LAB — RPR

## 2011-03-24 LAB — HIV-1 RNA QUANT-NO REFLEX-BLD
HIV 1 RNA Quant: <20 copies/mL (ref <20)
HIV-1 RNA Quant, Log: <1.3 {Log} (ref <1.30)

## 2011-04-05 ENCOUNTER — Encounter: Payer: Self-pay | Admitting: Internal Medicine

## 2011-04-05 ENCOUNTER — Ambulatory Visit (INDEPENDENT_AMBULATORY_CARE_PROVIDER_SITE_OTHER): Payer: 59 | Admitting: Internal Medicine

## 2011-04-05 VITALS — BP 124/80 | HR 93 | Temp 97.9°F | Ht 63.0 in | Wt 125.2 lb

## 2011-04-05 DIAGNOSIS — B20 Human immunodeficiency virus [HIV] disease: Secondary | ICD-10-CM

## 2011-04-05 NOTE — Progress Notes (Signed)
Patient ID: Tammy Henson, female   DOB: 1960/03/18, 51 y.o.   MRN: [id number]  INFECTIOUS DISEASE PROGRESS NOTE    Subjective: Tammy Henson is in for her routine visit. She finds her job increasingly stressful. She has not started looking for other work but is considering it. Up until last summer she had been going to the gym on a fairly regular basis and found that it helped with her stress but she out of the habit and quit going. She states she is also a little bit concerned about her 2 oldest children having left home recently. Her son is still in [place] with the [employer] having recently completed boot camp. She has a 38 year old daughter who is still at home and she is worried about the empty nest syndrome when she decides to leave. 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  Objective:   General: She is in good spirits Skin: No rash Lungs: Clear Cor: Regular S1 and S2 no murmurs Abdomen: Nontender   Lab Results Lab Results  Component Value Date   WBC 7.5 03/22/2011   HGB 13.1 03/22/2011   HCT 39.5 03/22/2011   MCV 101.3* 03/22/2011   PLT 421* 03/22/2011    Lab Results  Component Value Date   CREATININE 1.22* 03/22/2011   BUN 9 03/22/2011   NA 136 03/22/2011   K 4.3 03/22/2011   CL 103 03/22/2011   CO2 23 03/22/2011    Lab Results  Component Value Date   ALT 12 03/22/2011   AST 14 03/22/2011   ALKPHOS 104 03/22/2011   BILITOT 0.4 03/22/2011      HIV 1 RNA Quant (copies/mL)  Date Value  03/22/2011 <20   06/01/2010 <20   08/26/2009 78*     CD4 T Cell Abs (cmm)  Date Value  03/22/2011 480   06/01/2010 530   08/26/2009 360*    Assessment: Her HIV infection remains under excellent control. I will continue her current regimen and see her back after lab work in 6 months.  Her total cholesterol and triglycerides have gone up again and I suspect that this is in part due to the fact that she has had significant dietary indiscretion recently and is  not going to the gym on a regular basis. She seems motivated to make some lifestyle modifications and she does not want to start any new medications in the she actually has to. I've asked her to research that American [employer] Association low-fat diet and get back to the gym. We will repeat a lipid panel before next visit. I will also consider changing her antiretroviral regimen to one that has less impact on her lipids.  She is undergoing a great deal of stress and have suggested that getting back to the gym will also help with this.  Plan: 1. Continue her current antiretroviral regimen for now 2. She seems motivated to consider lifestyle modification to address her dyslipidemia 3. Return to clinic after lab work in 6 months   Cliffton Asters, MD [facility] [phone] pager   [phone] cell 04/05/2011, 4:41 PM

## 2011-04-28 ENCOUNTER — Other Ambulatory Visit: Payer: Self-pay | Admitting: Internal Medicine

## 2011-04-28 DIAGNOSIS — I10 Essential (primary) hypertension: Secondary | ICD-10-CM

## 2011-08-24 ENCOUNTER — Other Ambulatory Visit: Payer: Self-pay | Admitting: Internal Medicine

## 2011-09-15 ENCOUNTER — Telehealth: Payer: Self-pay | Admitting: *Deleted

## 2011-09-15 NOTE — Telephone Encounter (Signed)
Message left.  Needs appts for lab and MD for 6 month f/u.

## 2011-12-08 ENCOUNTER — Other Ambulatory Visit: Payer: Self-pay

## 2011-12-08 DIAGNOSIS — B20 Human immunodeficiency virus [HIV] disease: Secondary | ICD-10-CM

## 2011-12-08 MED ORDER — LOPINAVIR-RITONAVIR 200-50 MG PO TABS: 2.0000 | ORAL_TABLET | Freq: Two times a day (BID) | ORAL | Status: DC

## 2011-12-08 MED ORDER — STAVUDINE 40 MG PO CAPS: 40.0000 mg | ORAL_CAPSULE | Freq: Two times a day (BID) | ORAL | Status: DC

## 2011-12-08 MED ORDER — TENOFOVIR DISOPROXIL FUMARATE 300 MG PO TABS: 300.0000 mg | ORAL_TABLET | Freq: Every day | ORAL | Status: DC

## 2011-12-14 ENCOUNTER — Other Ambulatory Visit: Payer: Self-pay | Admitting: *Deleted

## 2011-12-14 ENCOUNTER — Other Ambulatory Visit: Payer: 59

## 2011-12-14 DIAGNOSIS — B20 Human immunodeficiency virus [HIV] disease: Secondary | ICD-10-CM

## 2011-12-14 LAB — COMPREHENSIVE METABOLIC PANEL
ALT: 18 U/L (ref 0–35)
CO2: 25 mEq/L (ref 19–32)
Creat: 0.81 mg/dL (ref 0.50–1.10)
Total Bilirubin: 0.3 mg/dL (ref 0.3–1.2)

## 2011-12-14 LAB — LIPID PANEL
Cholesterol: 176 mg/dL (ref 0–200)
Total CHOL/HDL Ratio: 4.4 Ratio
Triglycerides: 552 mg/dL — ABNORMAL HIGH (ref <150)

## 2011-12-14 LAB — CBC
MCH: 35 pg — ABNORMAL HIGH (ref 26.0–34.0)
MCV: 103.2 fL — ABNORMAL HIGH (ref 78.0–100.0)
Platelets: 355 10*3/uL (ref 150–400)
RBC: 3.46 MIL/uL — ABNORMAL LOW (ref 3.87–5.11)

## 2011-12-14 MED ORDER — STAVUDINE 40 MG PO CAPS: 40.0000 mg | ORAL_CAPSULE | Freq: Two times a day (BID) | ORAL | Status: DC

## 2011-12-15 LAB — T-HELPER CELL (CD4) - (RCID CLINIC ONLY): CD4 T Cell Abs: 420 uL (ref 400–2700)

## 2011-12-23 ENCOUNTER — Other Ambulatory Visit: Payer: Self-pay | Admitting: *Deleted

## 2011-12-23 DIAGNOSIS — B20 Human immunodeficiency virus [HIV] disease: Secondary | ICD-10-CM

## 2011-12-23 MED ORDER — STAVUDINE 40 MG PO CAPS: 40.0000 mg | ORAL_CAPSULE | Freq: Two times a day (BID) | ORAL | Status: DC

## 2011-12-23 MED ORDER — TENOFOVIR DISOPROXIL FUMARATE 300 MG PO TABS: 300.0000 mg | ORAL_TABLET | Freq: Every day | ORAL | Status: DC

## 2011-12-28 ENCOUNTER — Ambulatory Visit: Payer: 59 | Admitting: Internal Medicine

## 2012-01-09 ENCOUNTER — Ambulatory Visit: Payer: 59 | Admitting: Internal Medicine

## 2012-01-09 ENCOUNTER — Other Ambulatory Visit: Payer: Self-pay | Admitting: Internal Medicine

## 2012-01-25 ENCOUNTER — Encounter: Payer: Self-pay | Admitting: Internal Medicine

## 2012-01-25 ENCOUNTER — Ambulatory Visit (INDEPENDENT_AMBULATORY_CARE_PROVIDER_SITE_OTHER): Payer: 59 | Admitting: Internal Medicine

## 2012-01-25 VITALS — BP 116/78 | HR 82 | Temp 97.8°F | Ht 63.0 in | Wt 131.0 lb

## 2012-01-25 DIAGNOSIS — E785 Hyperlipidemia, unspecified: Secondary | ICD-10-CM

## 2012-01-25 DIAGNOSIS — J309 Allergic rhinitis, unspecified: Secondary | ICD-10-CM

## 2012-01-25 DIAGNOSIS — B20 Human immunodeficiency virus [HIV] disease: Secondary | ICD-10-CM

## 2012-01-25 DIAGNOSIS — Z23 Encounter for immunization: Secondary | ICD-10-CM

## 2012-01-25 MED ORDER — MOMETASONE FUROATE 50 MCG/ACT NA SUSP: 2.0000 | Freq: Every day | NASAL | Status: DC

## 2012-01-25 MED ORDER — OMEGA-3 FATTY ACIDS 1000 MG PO CAPS: 2.0000 g | ORAL_CAPSULE | Freq: Two times a day (BID) | ORAL | Status: DC

## 2012-01-25 NOTE — Progress Notes (Signed)
Patient ID: Tammy Henson, female   DOB: 1960/07/18, 51 y.o.   MRN: [id number]     [facility] for Infectious Disease  Patient Active Problem List  Diagnosis  . HIV DISEASE  . TINEA VERSICOLOR  . HYPERLIPIDEMIA  . LIPODYSTROPHY  . ANEMIA-NOS  . CARPAL TUNNEL SYNDROME  . HYPERTENSION  . ALLERGIC RHINITIS  . DIARRHEA, CHRONIC    Patient's Medications  New Prescriptions   FISH OIL-OMEGA-3 FATTY ACIDS 1000 MG CAPSULE    Take 2 capsules (2 g total) by mouth 2 (two) times daily.   MOMETASONE (NASONEX) 50 MCG/ACT NASAL SPRAY    Place 2 sprays into the nose daily.  Previous Medications   LISINOPRIL (PRINIVIL,ZESTRIL) 20 MG TABLET    TAKE ONE TABLET BY MOUTH ONE TIME DAILY   LOPINAVIR-RITONAVIR (KALETRA) 200-50 MG PER TABLET    Take 2 tablets by mouth 2 (two) times daily.   STAVUDINE (ZERIT) 40 MG CAPSULE    Take 1 capsule (40 mg total) by mouth every 12 (twelve) hours.   TENOFOVIR (VIREAD) 300 MG TABLET    Take 1 tablet (300 mg total) by mouth daily.  Modified Medications   No medications on file  Discontinued Medications   FLUTICASONE (FLONASE) 50 MCG/ACT NASAL SPRAY    Place 2 sprays into the nose daily.   MOMETASONE (NASONEX) 50 MCG/ACT NASAL SPRAY    2 sprays by Nasal route daily.      Subjective: Tammy Henson is in for her routine visit. She states that finances have been tight since her boyfriend lost his job. She is missed about a total of one week of her antiretroviral therapy in the last 6 months when she could not afford her co-pays. She knows that she should stop all of her medicines if she cannot get even one of them. She does not see this been a problem going forward as her boyfriend has just gotten a new job. She is under a lot of stress at work. She has not gotten back to the gym yet but states that her daughter has been encouraging him to join and start exercising more regularly. She would like to lose a little bit of weight. She is recently been taking Flonase for  her seasonal allergies but does not feel like it works as well as Nasonex did.  Objective: Temp: 97.8 F (36.6 C) (11/27 1335) Temp src: Oral (11/27 1335) BP: 116/78 mmHg (11/27 1335) Pulse Rate: 82  (11/27 1335)  General: She is in good spirits Skin: No rash Lungs: Clear Cor: Regular S1 and S2 with no murmurs Abdomen: No change in central adiposity  Lab Results HIV 1 RNA Quant (copies/mL)  Date Value  12/14/2011 <20   03/22/2011 <20   06/01/2010 <20      CD4 T Cell Abs (cmm)  Date Value  12/14/2011 420   03/22/2011 480   06/01/2010 530      Assessment: Her HIV infection remains under excellent control. I have encouraged her to set aside money and try to not Miss a single dose of her HIV medications.  I've encouraged her to get back to the gym and regular exercise.  I suggested that she start fish oil supplements for her hypertriglyceridemia.  I will change the Flonase back to Nasonex.  Plan: 1. Continue current HIV medications 2. Start fish oil supplements 3. Change Flonase to Nasonex 4. Encouraged to get regular exercise 5. Influenza vaccination today 6. Followup after lab work in 6 months  Cliffton Asters, MD [facility] for Infectious Disease [facility] [phone] pager   [phone] cell 01/25/2012, 2:01 PM

## 2012-01-31 ENCOUNTER — Other Ambulatory Visit: Payer: Self-pay | Admitting: *Deleted

## 2012-01-31 DIAGNOSIS — B20 Human immunodeficiency virus [HIV] disease: Secondary | ICD-10-CM

## 2012-01-31 MED ORDER — TENOFOVIR DISOPROXIL FUMARATE 300 MG PO TABS: 300.0000 mg | ORAL_TABLET | Freq: Every day | ORAL | Status: DC

## 2012-03-16 ENCOUNTER — Telehealth: Payer: Self-pay | Admitting: *Deleted

## 2012-03-16 NOTE — Telephone Encounter (Signed)
Patient feels her sinus congestion is worsening and may have developed a sinus infection. Patient never filled nasonex (too expensive) and feels the flonase is not working.  She wanted to have an antibiotic rx called in for her. RN advised patient that she should be seen in the office to evaluate the need for an RX and offered an appointment for next week. Patient declined, will be [place] of [place].  RN advised patient to go to an urgent care clinic for evaluation. Patient agreed. Andree Coss, RN

## 2012-04-07 ENCOUNTER — Other Ambulatory Visit: Payer: Self-pay | Admitting: Internal Medicine

## 2012-04-07 DIAGNOSIS — I10 Essential (primary) hypertension: Secondary | ICD-10-CM

## 2012-08-20 ENCOUNTER — Telehealth: Payer: Self-pay | Admitting: *Deleted

## 2012-08-20 NOTE — Telephone Encounter (Signed)
Message left requesting pt call [facility] to schedule Lab and MD appt.  PAP smear appt as well if not receiving at another office.  If receiving at another requested pt have that office fax PAP smear results to Dr. Orvan Falconer.

## 2012-09-04 ENCOUNTER — Encounter: Payer: Self-pay | Admitting: *Deleted

## 2012-10-08 ENCOUNTER — Other Ambulatory Visit: Payer: Self-pay | Admitting: Internal Medicine

## 2012-11-27 ENCOUNTER — Encounter: Payer: Self-pay | Admitting: Internal Medicine

## 2013-01-08 ENCOUNTER — Telehealth: Payer: Self-pay | Admitting: *Deleted

## 2013-01-08 NOTE — Telephone Encounter (Signed)
Requested pt call [facility] for lab and MD appts.  

## 2013-01-09 ENCOUNTER — Other Ambulatory Visit: Payer: 59

## 2013-01-09 DIAGNOSIS — B20 Human immunodeficiency virus [HIV] disease: Secondary | ICD-10-CM

## 2013-01-09 LAB — LIPID PANEL
Cholesterol: 206 mg/dL — ABNORMAL HIGH (ref 0–200)
HDL: 43 mg/dL (ref >39)
Total CHOL/HDL Ratio: 4.8 Ratio
Triglycerides: 554 mg/dL — ABNORMAL HIGH (ref <150)

## 2013-01-09 LAB — COMPREHENSIVE METABOLIC PANEL
Alkaline Phosphatase: 95 U/L (ref 39–117)
BUN: 7 mg/dL (ref 6–23)
CO2: 26 mEq/L (ref 19–32)
Creat: 0.87 mg/dL (ref 0.50–1.10)
Glucose, Bld: 132 mg/dL — ABNORMAL HIGH (ref 70–99)
Total Bilirubin: 0.5 mg/dL (ref 0.3–1.2)

## 2013-01-10 LAB — CBC
HCT: 38.6 % (ref 36.0–46.0)
Hemoglobin: 13.5 g/dL (ref 12.0–15.0)
MCV: 95.5 fL (ref 78.0–100.0)
RBC: 4.04 MIL/uL (ref 3.87–5.11)
WBC: 7.2 10*3/uL (ref 4.0–10.5)

## 2013-01-10 LAB — T-HELPER CELL (CD4) - (RCID CLINIC ONLY): CD4 % Helper T Cell: 14 % — ABNORMAL LOW (ref 33–55)

## 2013-01-21 ENCOUNTER — Telehealth: Payer: Self-pay | Admitting: *Deleted

## 2013-01-21 NOTE — Telephone Encounter (Signed)
Patient called and left a message for Tammy Henson to [facility] [address] I called the patient back and left a message for her that she can have refills authorized at her visit with Dr Orvan Falconer on 01/29/13 as we have not seen her in a year and he may want to change her medications.

## 2013-01-22 ENCOUNTER — Other Ambulatory Visit: Payer: Self-pay

## 2013-01-22 DIAGNOSIS — B20 Human immunodeficiency virus [HIV] disease: Secondary | ICD-10-CM

## 2013-01-22 MED ORDER — TENOFOVIR DISOPROXIL FUMARATE 300 MG PO TABS: 300.0000 mg | ORAL_TABLET | Freq: Every day | ORAL | Status: DC

## 2013-01-22 MED ORDER — LOPINAVIR-RITONAVIR 200-50 MG PO TABS: 2.0000 | ORAL_TABLET | Freq: Two times a day (BID) | ORAL | Status: DC

## 2013-01-22 NOTE — Telephone Encounter (Signed)
Patient calling for scripts. She has office visit with Dr. Orvan Falconer on tomorrow.  November 26,2014. She was refused yesterday.   I will send script to pharmacy. Pt states she will keep her appointment.   Tomasita Morrow, RN

## 2013-01-29 ENCOUNTER — Ambulatory Visit (INDEPENDENT_AMBULATORY_CARE_PROVIDER_SITE_OTHER): Payer: BC Managed Care – PPO | Admitting: Internal Medicine

## 2013-01-29 VITALS — BP 130/83 | HR 84 | Temp 98.4°F | Ht 62.5 in | Wt 129.8 lb

## 2013-01-29 DIAGNOSIS — F411 Generalized anxiety disorder: Secondary | ICD-10-CM

## 2013-01-29 DIAGNOSIS — J309 Allergic rhinitis, unspecified: Secondary | ICD-10-CM

## 2013-01-29 DIAGNOSIS — B20 Human immunodeficiency virus [HIV] disease: Secondary | ICD-10-CM

## 2013-01-29 DIAGNOSIS — Z23 Encounter for immunization: Secondary | ICD-10-CM

## 2013-01-29 DIAGNOSIS — F419 Anxiety disorder, unspecified: Secondary | ICD-10-CM | POA: Insufficient documentation

## 2013-01-29 MED ORDER — RITONAVIR 100 MG PO TABS: 100.0000 mg | ORAL_TABLET | Freq: Every day | ORAL | Status: DC

## 2013-01-29 MED ORDER — FLUTICASONE PROPIONATE 50 MCG/ACT NA SUSP: 2.0000 | Freq: Every day | NASAL | Status: DC

## 2013-01-29 MED ORDER — EMTRICITABINE-TENOFOVIR DF 200-300 MG PO TABS: 1.0000 | ORAL_TABLET | Freq: Every day | ORAL | Status: DC

## 2013-01-29 MED ORDER — LORATADINE 10 MG PO CAPS: 10.0000 mg | ORAL_CAPSULE | Freq: Every day | ORAL | Status: DC

## 2013-01-29 MED ORDER — DARUNAVIR ETHANOLATE 400 MG PO TABS: 400.0000 mg | ORAL_TABLET | Freq: Every day | ORAL | Status: DC

## 2013-01-29 NOTE — Progress Notes (Signed)
Patient ID: Tammy Henson, female   DOB: 14-Dec-1960, 52 y.o.   MRN: [id number]    HPI: Tammy Henson is a 52 y.o. female with HIV that has been controlled for a long time. She is here for f/u visit.  Allergies: No Known Allergies  Vitals: Temp: 98.4 F (36.9 C) (12/02 1003) Temp src: Oral (12/02 1003) BP: 130/83 mmHg (12/02 1003) Pulse Rate: 84 (12/02 1003)  Past Medical History: No past medical history on file.  Social History: History   Social History  . Marital Status: Widowed    Spouse Name: N/A    Number of Children: N/A  . Years of Education: N/A   Social History Main Topics  . Smoking status: Never Smoker   . Smokeless tobacco: Never Used  . Alcohol Use: No  . Drug Use: No  . Sexual Activity: Yes    Partners: Male     Comment: offered condoms   Other Topics Concern  . Not on file   Social History Narrative  . No narrative on file    Previous Regimen:   Current Regimen: KLT+TDF+D4T  Labs: HIV 1 RNA Quant (copies/mL)  Date Value  01/09/2013 <20   12/14/2011 <20   03/22/2011 <20      CD4 T Cell Abs (/uL)  Date Value  01/09/2013 490   12/14/2011 420   03/22/2011 480      Hep B S Ab (no units)  Date Value  04/24/2006 NO      Hepatitis B Surface Ag (no units)  Date Value  04/24/2006 NO      HCV Ab (no units)  Date Value  04/24/2006 NO     CrCl: Estimated Creatinine Clearance: 59.9 ml/min (by C-G formula based on Cr of 0.87).  Lipids:    Component Value Date/Time   CHOL 206* 01/09/2013 1414   TRIG 554* 01/09/2013 1414   HDL 43 01/09/2013 1414   CHOLHDL 4.8 01/09/2013 1414   VLDL NOT CALC 01/09/2013 1414   LDLCALC Comment:   Not calculated due to Triglyceride >400. Suggest ordering Direct LDL (Unit Code: 40981).   Total Cholesterol/HDL Ratio:CHD Risk                        Coronary Heart Disease Risk Table                                        Men       Women          1/2 Average Risk              3.4        3.3               Average Risk              5.0        4.4           2X Average Risk              9.6        7.1           3X Average Risk             23.4       11.0 Use the calculated Patient Ratio above and the CHD Risk table  to determine the patient's CHD  Risk. ATP III Classification (LDL):       < 100        mg/dL         Optimal      454 - 129     mg/dL         Near or Above Optimal      130 - 159     mg/dL         Borderline High      160 - 189     mg/dL         High       > 098        mg/dL         Very High   11/91/4782 1414    Assessment: She is doing really well. Her triglyceride has always been elevated. This is likely due to Kaletra. I spoke with her about changing her regimen around so that it's easier and doesn't affect her lipids profile. She agree to the plan  Recommendations: Dc KLT/D4T/TDF Start DRV/r + Truvada  Clide Cliff, PharmD Clinical Infectious Disease Pharmacist [facility] for Infectious Disease 01/29/2013, 10:21 AM

## 2013-01-29 NOTE — Progress Notes (Signed)
Patient ID: Tammy Henson, female   DOB: Feb 25, 1961, 52 y.o.   MRN: [id number]          [facility] for Infectious Disease  Patient Active Problem List   Diagnosis Date Noted  . HIV DISEASE 12/31/2005    Priority: High  . Anxiety 01/29/2013  . CARPAL TUNNEL SYNDROME 09/10/2009  . 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 DIARRHEA, CHRONIC 07/31/2008  . TINEA VERSICOLOR 12/31/2005  . HYPERLIPIDEMIA 12/31/2005  . ANEMIA-NOS 12/31/2005  . HYPERTENSION 12/31/2005  . ALLERGIC RHINITIS 12/31/2005    Patient's Medications  New Prescriptions   DARUNAVIR (PREZISTA) 400 MG TABLET    Take 1 tablet (400 mg total) by mouth daily.   EMTRICITABINE-TENOFOVIR (TRUVADA) 200-300 MG PER TABLET    Take 1 tablet by mouth daily.   LORATADINE 10 MG CAPS    Take 1 capsule (10 mg total) by mouth daily.   RITONAVIR (NORVIR) 100 MG TABS TABLET    Take 1 tablet (100 mg total) by mouth daily.  Previous Medications   FISH OIL-OMEGA-3 FATTY ACIDS 1000 MG CAPSULE    Take 2 capsules (2 g total) by mouth 2 (two) times daily.   LISINOPRIL (PRINIVIL,ZESTRIL) 20 MG TABLET    Take one tablet by mouth one time daily  Modified Medications   Modified Medication Previous Medication   FLUTICASONE (FLONASE) 50 MCG/ACT NASAL SPRAY fluticasone (FLONASE) 50 MCG/ACT nasal spray      Place 2 sprays into both nostrils daily.    Place into both nostrils daily.  Discontinued Medications   LOPINAVIR-RITONAVIR (KALETRA) 200-50 MG PER TABLET    Take 2 tablets by mouth 2 (two) times daily.   MOMETASONE (NASONEX) 50 MCG/ACT NASAL SPRAY    Place 2 sprays into the nose daily.   STAVUDINE (ZERIT) 40 MG CAPSULE    Take 1 capsule (40 mg total) by mouth every 12 (twelve) hours.   TENOFOVIR (VIREAD) 300 MG TABLET    Take 1 tablet (300 mg total) by mouth daily.    Subjective: Blessing is in for her routine visit. She became an empty nester since her last visit and has found that to be quite an adjustment period. She was able to spend  Thanksgiving with her 2 daughters but was not able to see her sons. She has felt much more stressed at work and this has led to unusual anxiety. She is considered looking for new work but worries about the financial strain of losing her job or being out of work. She's been bothered by congestion and has been out of her Flonase. She does not think that she is missed any doses of her antiretroviral medications. She is bothered by minor but chronic diarrhea.  Review of Systems: Constitutional: negative for anorexia, fevers and weight loss Eyes: negative Ears, nose, mouth, throat, and face: positive for nasal congestion, negative for hoarseness, sore mouth and sore throat Respiratory: negative Cardiovascular: negative Gastrointestinal: positive for diarrhea, negative for abdominal pain, change in bowel habits, constipation, nausea and vomiting Genitourinary:negative Behavioral/Psych: positive for anxiety, negative for depression  No past medical history on file.  History  Substance Use Topics  . Smoking status: Never Smoker   . Smokeless tobacco: Never Used  . Alcohol Use: No    No family history on file.  No Known Allergies  Objective: Temp: 98.4 F (36.9 C) (12/02 1003) Temp src: Oral (12/02 1003) BP: 130/83 mmHg (12/02 1003) Pulse Rate: 84 (12/02 1003)  General: She is in no distress. Her weight is  unchanged Oral: No oropharyngeal lesions Skin: No rash Lungs: Clear Cor: Regular S1 and S2 no murmurs Abdomen: Soft and nontender Joints and extremities: No acute abnormalities Neuro: Alert and fully oriented with normal speech and conversation Mood and affect: Normal inappropriate  Lab Results HIV 1 RNA Quant (copies/mL)  Date Value  01/09/2013 <20   12/14/2011 <20   03/22/2011 <20      CD4 T Cell Abs (/uL)  Date Value  01/09/2013 490   12/14/2011 420   03/22/2011 480      Assessment: Her HIV infection remains under excellent control. She is on a rather old and  complicated regimen and has never had documented resistance. She had problems tolerating Sustiva-based regimen in the past. Her Kaletra is likely to contribute to her diarrhea and dyslipidemia. I will change her to Truvada and boosted Prezista.  She is more stressed and anxious than normal and is beginning to have some problems at work. She is worried about being fired because with the way she had a 1 called with a customer. I suggested that she see her mental health counselor but she prefers to not do that now. She is currently talking with friends in members of her church and wants to try to establish a plan to change jobs. She promises to call back if she is feeling worse and needs to see her counselor.  I suspect that her chronic sinus congestion is due to seasonal allergies and suggested that in addition to refilling her Flonase to try over-the-counter loratadine.  Plan: 1. Change antiretroviral regimen to Truvada and boosted Prezista 2. Refill Flonase 3. Start over-the-counter loratadine 4. Monitor anxiety 5. Followup with repeat lab work including CD4 count and viral load and lipid panel in 6 months   Cliffton Asters, MD [facility] for Infectious Disease [facility] [phone] pager   [phone] cell 01/29/2013, 2:31 PM

## 2013-02-05 ENCOUNTER — Telehealth: Payer: Self-pay | Admitting: *Deleted

## 2013-02-05 DIAGNOSIS — B20 Human immunodeficiency virus [HIV] disease: Secondary | ICD-10-CM

## 2013-02-05 MED ORDER — DARUNAVIR ETHANOLATE 800 MG PO TABS: 800.0000 mg | ORAL_TABLET | Freq: Every day | ORAL | Status: DC

## 2013-02-05 NOTE — Telephone Encounter (Signed)
[facility] is [facility] in obtaining the Prezista.  The [facility] is [facility] for [facility]-up.

## 2013-02-06 ENCOUNTER — Telehealth: Payer: Self-pay | Admitting: *Deleted

## 2013-02-06 NOTE — Telephone Encounter (Signed)
Requesting antibiotic for "sinus infection."  RN advised that pt would need to be examined.  No appointments available this afternoon.  Offered appointment for tomorrow morning with Clinic MD.  Pt has to work tomorrow, was "off" today.  Did not want to schedule appt for tomorrow.  Stated that she would go to urgent care today.

## 2013-03-19 ENCOUNTER — Other Ambulatory Visit: Payer: Self-pay | Admitting: Internal Medicine

## 2013-07-19 ENCOUNTER — Other Ambulatory Visit: Payer: Self-pay | Admitting: Internal Medicine

## 2013-09-11 ENCOUNTER — Other Ambulatory Visit: Payer: BC Managed Care – PPO

## 2013-09-11 DIAGNOSIS — Z113 Encounter for screening for infections with a predominantly sexual mode of transmission: Secondary | ICD-10-CM

## 2013-09-11 DIAGNOSIS — B20 Human immunodeficiency virus [HIV] disease: Secondary | ICD-10-CM

## 2013-09-11 DIAGNOSIS — Z79899 Other long term (current) drug therapy: Secondary | ICD-10-CM

## 2013-09-11 LAB — LIPID PANEL
CHOLESTEROL: 189 mg/dL (ref 0–200)
HDL: 64 mg/dL (ref >39)
LDL Cholesterol: 76 mg/dL (ref 0–99)
TRIGLYCERIDES: 245 mg/dL — AB (ref <150)
Total CHOL/HDL Ratio: 3 Ratio
VLDL: 49 mg/dL — AB (ref 0–40)

## 2013-09-11 LAB — CBC
HCT: 41 % (ref 36.0–46.0)
Hemoglobin: 14.2 g/dL (ref 12.0–15.0)
MCH: 31.4 pg (ref 26.0–34.0)
MCHC: 34.6 g/dL (ref 30.0–36.0)
MCV: 90.7 fL (ref 78.0–100.0)
Platelets: 359 10*3/uL (ref 150–400)
RBC: 4.52 MIL/uL (ref 3.87–5.11)
RDW: 13.5 % (ref 11.5–15.5)
WBC: 7.7 10*3/uL (ref 4.0–10.5)

## 2013-09-11 LAB — COMPREHENSIVE METABOLIC PANEL
ALBUMIN: 4.5 g/dL (ref 3.5–5.2)
ALT: 21 U/L (ref 0–35)
AST: 16 U/L (ref 0–37)
Alkaline Phosphatase: 97 U/L (ref 39–117)
BILIRUBIN TOTAL: 0.5 mg/dL (ref 0.2–1.2)
BUN: 11 mg/dL (ref 6–23)
CO2: 23 meq/L (ref 19–32)
Calcium: 9.1 mg/dL (ref 8.4–10.5)
Chloride: 101 mEq/L (ref 96–112)
Creat: 0.91 mg/dL (ref 0.50–1.10)
Glucose, Bld: 136 mg/dL — ABNORMAL HIGH (ref 70–99)
Potassium: 4.2 mEq/L (ref 3.5–5.3)
SODIUM: 135 meq/L (ref 135–145)
Total Protein: 7.4 g/dL (ref 6.0–8.3)

## 2013-09-11 LAB — RPR

## 2013-09-12 LAB — HIV-1 RNA QUANT-NO REFLEX-BLD: HIV-1 RNA Quant, Log: <1.3 {Log} (ref <1.30)

## 2013-09-12 LAB — T-HELPER CELL (CD4) - (RCID CLINIC ONLY)
CD4 T CELL ABS: 520 /uL (ref 400–2700)
CD4 T CELL HELPER: 17 % — AB (ref 33–55)

## 2013-10-08 ENCOUNTER — Ambulatory Visit: Payer: BC Managed Care – PPO | Admitting: Internal Medicine

## 2013-10-08 ENCOUNTER — Telehealth: Payer: Self-pay | Admitting: *Deleted

## 2013-10-08 NOTE — Telephone Encounter (Signed)
Left message requesting pt call for a new 65-month f/u appt.

## 2013-10-23 ENCOUNTER — Encounter: Payer: Self-pay | Admitting: Internal Medicine

## 2013-10-23 ENCOUNTER — Ambulatory Visit (INDEPENDENT_AMBULATORY_CARE_PROVIDER_SITE_OTHER): Payer: BC Managed Care – PPO | Admitting: Internal Medicine

## 2013-10-23 VITALS — BP 105/74 | HR 72 | Temp 97.7°F | Wt 136.8 lb

## 2013-10-23 DIAGNOSIS — Z23 Encounter for immunization: Secondary | ICD-10-CM

## 2013-10-23 DIAGNOSIS — J309 Allergic rhinitis, unspecified: Secondary | ICD-10-CM

## 2013-10-23 DIAGNOSIS — B20 Human immunodeficiency virus [HIV] disease: Secondary | ICD-10-CM

## 2013-10-23 MED ORDER — FLUNISOLIDE 25 MCG/ACT (0.025%) NA SOLN: 2.0000 | Freq: Two times a day (BID) | NASAL | Status: DC

## 2013-10-23 MED ORDER — DARUNAVIR-COBICISTAT 800-150 MG PO TABS: 1.0000 | ORAL_TABLET | Freq: Every day | ORAL | Status: DC

## 2013-10-23 NOTE — Progress Notes (Signed)
Patient ID: Tammy Henson, female   DOB: Aug 04, 1960, 53 y.o.   MRN: [id number]          Patient Active Problem List   Diagnosis Date Noted  . HIV DISEASE 12/31/2005    Priority: High  . Anxiety 01/29/2013  . CARPAL TUNNEL SYNDROME 09/10/2009  . 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 DIARRHEA, CHRONIC 07/31/2008  . TINEA VERSICOLOR 12/31/2005  . HYPERLIPIDEMIA 12/31/2005  . ANEMIA-NOS 12/31/2005  . HYPERTENSION 12/31/2005  . ALLERGIC RHINITIS 12/31/2005    Patient's Medications  New Prescriptions   DARUNAVIR-COBICISTAT (PREZCOBIX) 800-150 MG PER TABLET    Take 1 tablet by mouth daily. Swallow whole. Do NOT crush, break or chew tablets. Take with food.   FLUNISOLIDE (NASALIDE) 25 MCG/ACT (0.025%) SOLN    Place 2 sprays into the nose 2 (two) times daily.  Previous Medications   EMTRICITABINE-TENOFOVIR (TRUVADA) 200-300 MG PER TABLET    Take 1 tablet by mouth daily.   FISH OIL-OMEGA-3 FATTY ACIDS 1000 MG CAPSULE    Take 2 capsules (2 g total) by mouth 2 (two) times daily.   LISINOPRIL (PRINIVIL,ZESTRIL) 20 MG TABLET    TAKE ONE TABLET BY MOUTH ONE TIME DAILY    LORATADINE 10 MG CAPS    Take 1 capsule (10 mg total) by mouth daily.  Modified Medications   No medications on file  Discontinued Medications   DARUNAVIR (PREZISTA) 800 MG TABLET    Take 1 tablet (800 mg total) by mouth daily. With Norvir.   FLUTICASONE (FLONASE) 50 MCG/ACT NASAL SPRAY    Place 2 sprays into both nostrils daily.   RITONAVIR (NORVIR) 100 MG TABS TABLET    Take 1 tablet (100 mg total) by mouth daily.    Subjective: Angi is in for her routine visit. She is tolerating her new antiretroviral regimen well. She is having additional stress related to her work and has made a decision that she will quit and find a new job. Review of Systems: Pertinent items are noted in HPI.  No past medical history on file.  History  Substance Use Topics  . Smoking status: Never Smoker   . Smokeless tobacco: Never Used    . Alcohol Use: No    No family history on file.  No Known Allergies  Objective: Temp: 97.7 F (36.5 C) (08/26 0900) Temp src: Oral (08/26 0900) BP: 105/74 mmHg (08/26 0900) Pulse Rate: 72 (08/26 0900) Body mass index is 24.6 kg/(m^2).  General: she is in good spirits as usual Oral: no oropharyngeal lesions Skin: no rash Lungs: clear Cor: regular S1 and S2 with no murmurs  Lab Results Lab Results  Component Value Date   WBC 7.7 09/11/2013   HGB 14.2 09/11/2013   HCT 41.0 09/11/2013   MCV 90.7 09/11/2013   PLT 359 09/11/2013    Lab Results  Component Value Date   CREATININE 0.91 09/11/2013   BUN 11 09/11/2013   NA 135 09/11/2013   K 4.2 09/11/2013   CL 101 09/11/2013   CO2 23 09/11/2013    Lab Results  Component Value Date   ALT 21 09/11/2013   AST 16 09/11/2013   ALKPHOS 97 09/11/2013   BILITOT 0.5 09/11/2013    Lab Results  Component Value Date   CHOL 189 09/11/2013   HDL 64 09/11/2013   LDLCALC 76 09/11/2013   TRIG 245* 09/11/2013   CHOLHDL 3.0 09/11/2013    Lab Results HIV 1 RNA Quant (copies/mL)  Date Value  09/11/2013 <20  01/09/2013 <20   12/14/2011 <20      CD4 T Cell Abs (/uL)  Date Value  09/11/2013 520   01/09/2013 490   12/14/2011 420      Assessment: Her HIV infection remains under excellent control. I will simplify her regimen further to Truvada and Prezcobix. This will require a change in her Flonase due to the drug drug interaction between Prezcobix and Flonase. I will change Flonase to Nasalide.  Plan: 1. Truvada and Prazcobix 2. Change Flonase to Nasalide 3. She will call if she may lose her insurance otherwise F/U in 6 months   Cliffton Asters, MD [facility] for Infectious Disease [facility] [phone] pager   [phone] cell 10/23/2013, 9:18 AM

## 2013-10-29 ENCOUNTER — Ambulatory Visit: Payer: BC Managed Care – PPO

## 2013-11-19 ENCOUNTER — Other Ambulatory Visit: Payer: Self-pay | Admitting: *Deleted

## 2013-11-19 DIAGNOSIS — B20 Human immunodeficiency virus [HIV] disease: Secondary | ICD-10-CM

## 2013-11-19 MED ORDER — DARUNAVIR-COBICISTAT 800-150 MG PO TABS: 1.0000 | ORAL_TABLET | Freq: Every day | ORAL | Status: DC

## 2013-11-19 NOTE — Telephone Encounter (Signed)
[facility] [address] [facility] [address].

## 2014-01-19 ENCOUNTER — Other Ambulatory Visit: Payer: Self-pay | Admitting: Internal Medicine

## 2014-01-30 ENCOUNTER — Other Ambulatory Visit: Payer: Self-pay | Admitting: Internal Medicine

## 2014-02-25 ENCOUNTER — Telehealth: Payer: Self-pay | Admitting: *Deleted

## 2014-02-25 DIAGNOSIS — B20 Human immunodeficiency virus [HIV] disease: Secondary | ICD-10-CM

## 2014-02-25 NOTE — Telephone Encounter (Signed)
Patient called and advised her medication was changed in 09/2013 and she wants to go back on her old medication. Advised her because we have not seen her or had any labs she will need to schedule an appt. She wants me to ask the doctor before she schedules an appt and call her back.

## 2014-02-27 ENCOUNTER — Other Ambulatory Visit: Payer: Self-pay | Admitting: Internal Medicine

## 2014-02-27 MED ORDER — DARUNAVIR ETHANOLATE 800 MG PO TABS: 800.0000 mg | ORAL_TABLET | Freq: Every day | ORAL | Status: DC

## 2014-02-27 MED ORDER — RITONAVIR 100 MG PO TABS: 100.0000 mg | ORAL_TABLET | Freq: Every day | ORAL | Status: DC

## 2014-02-27 NOTE — Addendum Note (Signed)
Addended by: Cliffton AstersAMPBELL, Tegan Britain on: 02/27/2014 09:31 AM   Modules accepted: Orders, Medications

## 2014-02-27 NOTE — Telephone Encounter (Signed)
I changed Prezcobix back to Prezista and Norvir. Please ask Tammy Henson what problem she was having tolerating Prezcobix. Also please make sure that she has a visit for labs and an appointment with me within the next month.

## 2014-02-27 NOTE — Telephone Encounter (Signed)
Called patient and had to leave a message for her to call the office back.

## 2014-03-26 ENCOUNTER — Telehealth: Payer: Self-pay | Admitting: *Deleted

## 2014-03-26 NOTE — Telephone Encounter (Signed)
Tammy Henson has been approved by the [employer] for the $7500 grant.  She is eligible through 03-17-15

## 2014-06-21 ENCOUNTER — Other Ambulatory Visit: Payer: Self-pay | Admitting: Internal Medicine

## 2014-07-14 ENCOUNTER — Other Ambulatory Visit: Payer: BLUE CROSS/BLUE SHIELD

## 2014-07-14 DIAGNOSIS — Z79899 Other long term (current) drug therapy: Secondary | ICD-10-CM

## 2014-07-14 DIAGNOSIS — B20 Human immunodeficiency virus [HIV] disease: Secondary | ICD-10-CM

## 2014-07-14 DIAGNOSIS — Z113 Encounter for screening for infections with a predominantly sexual mode of transmission: Secondary | ICD-10-CM

## 2014-07-14 LAB — COMPREHENSIVE METABOLIC PANEL
ALT: 19 U/L (ref 0–35)
AST: 12 U/L (ref 0–37)
Albumin: 3.7 g/dL (ref 3.5–5.2)
Alkaline Phosphatase: 98 U/L (ref 39–117)
BUN: 10 mg/dL (ref 6–23)
CHLORIDE: 103 meq/L (ref 96–112)
CO2: 25 meq/L (ref 19–32)
CREATININE: 1.15 mg/dL — AB (ref 0.50–1.10)
Calcium: 8.8 mg/dL (ref 8.4–10.5)
GLUCOSE: 144 mg/dL — AB (ref 70–99)
Potassium: 4.2 mEq/L (ref 3.5–5.3)
SODIUM: 137 meq/L (ref 135–145)
Total Bilirubin: 0.3 mg/dL (ref 0.2–1.2)
Total Protein: 6.7 g/dL (ref 6.0–8.3)

## 2014-07-14 LAB — CBC
HCT: 40.6 % (ref 36.0–46.0)
Hemoglobin: 13.3 g/dL (ref 12.0–15.0)
MCH: 31.3 pg (ref 26.0–34.0)
MCHC: 32.8 g/dL (ref 30.0–36.0)
MCV: 95.5 fL (ref 78.0–100.0)
MPV: 9 fL (ref 8.6–12.4)
Platelets: 339 10*3/uL (ref 150–400)
RBC: 4.25 MIL/uL (ref 3.87–5.11)
RDW: 13.7 % (ref 11.5–15.5)
WBC: 9.8 10*3/uL (ref 4.0–10.5)

## 2014-07-14 LAB — LIPID PANEL
Cholesterol: 164 mg/dL (ref 0–200)
HDL: 61 mg/dL (ref >=46)
LDL CALC: 70 mg/dL (ref 0–99)
Total CHOL/HDL Ratio: 2.7 Ratio
Triglycerides: 166 mg/dL — ABNORMAL HIGH (ref <150)
VLDL: 33 mg/dL (ref 0–40)

## 2014-07-15 LAB — T-HELPER CELL (CD4) - (RCID CLINIC ONLY)
CD4 % Helper T Cell: 21 % — ABNORMAL LOW (ref 33–55)
CD4 T Cell Abs: 560 /uL (ref 400–2700)

## 2014-07-15 LAB — HIV-1 RNA QUANT-NO REFLEX-BLD: HIV-1 RNA Quant, Log: <1.3 {Log} (ref <1.30)

## 2014-07-15 LAB — RPR

## 2014-07-31 ENCOUNTER — Encounter: Payer: Self-pay | Admitting: Internal Medicine

## 2014-07-31 ENCOUNTER — Ambulatory Visit (INDEPENDENT_AMBULATORY_CARE_PROVIDER_SITE_OTHER): Payer: BLUE CROSS/BLUE SHIELD | Admitting: Internal Medicine

## 2014-07-31 DIAGNOSIS — B20 Human immunodeficiency virus [HIV] disease: Secondary | ICD-10-CM

## 2014-07-31 NOTE — Progress Notes (Signed)
Subjective:    Patient ID: Tammy Henson, female    DOB: Jun 13, 1960, 54 y.o.   MRN: [id number]  HPI  Tammy Henson is a 54 y/o female here for HIV f/u. Her most recent CD4 count is 560/VL is undetectable. At her last visit she was switched from Prezista, Norvir and Truvada to Prezcobix and Truvada.  At this time she had been fighting a sinus infection and was put on Bactrim by her PCP. She began feeling foggy and just "not right". She was not sure which contributed to her fogginess but was afraid of the new medications and felt better sticking to her older regimen. She called in to switch back to the Norvir, Prezista and Truvada. She has taken these every day without missed doses. She states that she has been under a lot of stress with work and is having difficulty staying asleep at night. She feels very fatigued during the day. Her daughter has been trying to encourage her to go to the gym, but she has not found the energy to do so. She also states that she is feeling self-conscious about having a "buffalo hump" and a "belly". We discussed that her HIV medicines can cause these types of fat distributions. She denies any other concerns.  Outpatient Encounter Prescriptions as of 07/31/2014  Medication Sig  . Darunavir Ethanolate (PREZISTA) 800 MG tablet Take 1 tablet (800 mg total) by mouth daily with breakfast.  . fish oil-omega-3 fatty acids 1000 MG capsule Take 2 capsules (2 g total) by mouth 2 (two) times daily.  . flunisolide (NASALIDE) 25 MCG/ACT (0.025%) SOLN Place 2 sprays into the nose 2 (two) times daily.  Marland Kitchen lisinopril (PRINIVIL,ZESTRIL) 20 MG tablet TAKE ONE TABLET BY MOUTH ONE TIME DAILY  . Loratadine 10 MG CAPS Take 1 capsule (10 mg total) by mouth daily.  . ritonavir (NORVIR) 100 MG TABS tablet Take 1 tablet (100 mg total) by mouth daily.  . TRUVADA 200-300 MG per tablet TAKE 1 TABLET BY MOUTH DAILY.   No facility-administered encounter medications on file as of 07/31/2014.      Lab Results  Component Value Date   HIV1RNAQUANT <20 07/14/2014    Lab Results  Component Value Date   CD4TABS 560 07/14/2014   CD4TABS 520 09/11/2013   CD4TABS 490 01/09/2013    Review of Systems  Constitutional: Positive for fatigue. Negative for fever, chills, diaphoresis and unexpected weight change.  HENT: Negative for mouth sores and trouble swallowing.   Eyes: Negative for visual disturbance.  Respiratory: Negative for cough and shortness of breath.   Cardiovascular: Negative for chest pain and leg swelling.  Gastrointestinal: Negative for nausea, abdominal pain, diarrhea, constipation and abdominal distention.  Genitourinary: Negative for difficulty urinating.  Musculoskeletal: Negative for back pain, arthralgias and neck pain.  Neurological: Negative for dizziness, syncope and numbness.  Hematological: Negative for adenopathy.  Psychiatric/Behavioral: Positive for sleep disturbance. Negative for behavioral problems and dysphoric mood.       Objective:   Physical Exam  Constitutional: She is oriented to person, place, and time. She appears well-developed and well-nourished.  HENT:  Head: Normocephalic and atraumatic.  Mouth/Throat: Oropharynx is clear and moist.  Eyes: Conjunctivae are normal. Pupils are equal, round, and reactive to light.  Neck: Normal range of motion. Neck supple.  Buffalo hump present  Cardiovascular: Normal rate, regular rhythm and normal heart sounds.   Pulmonary/Chest: Effort normal and breath sounds normal.  Abdominal: Soft. Bowel sounds are normal.  Obese abdomen  Musculoskeletal: Normal range of motion.  Lymphadenopathy:    She has no cervical adenopathy.  Neurological: She is alert and oriented to person, place, and time.  Skin: Skin is warm and dry. No rash noted. She is not diaphoretic.  Psychiatric: She has a normal mood and affect. Her behavior is normal. Judgment and thought content normal.          Assessment &  Plan:   1) HIV - Continue Prezista, Norvir and Truvada. sCr slightly elevated. Will check labs with an HLA before her next visit in the case. We discussed that we may try and simplify her regimen again to Prezcobix at that time. She is in agreement with this. - RTC for f/u in 3 months with labs at this time.  2) Sleep disturbance - Discussed importance of exercise in getting a good nights rest.  3) Fatigue - Likely related to her level of stress and difficulty sleeping. She identifies the fatigue as a barrier to going to the gym. Discussed having accountability with daughter and pushing past the feeling of fatigue.  4) Work related stress - Discussed ways she can reduce stress which included going to the gym with her daughter and also using this time to spend bonding with her family.    Addendum: I have seen and examined Tammy Henson with Leman Martinek. Tammy Henson's HIV infection remains under excellent control. It is possible she may have had some adverse reaction after she switch to Prezcobix but I suspect that a more likely explanation would be the sinus congestion that she was suffering at the time or possibly a reaction to trimethoprim sulfamethoxazole. I will continue her current anti-retroviral regimen for now but have told her that she still has the option of retrying Prezcobix in the future. She's had a very mild bump in her creatinine. I will see her back in 3 months after lab work including HLA B 5701 to see if she needs a change in her regimen.  She continues to have some stress related to her work. This is probably exacerbated by a centering lifestyle. We have encouraged her to get back to the gym. 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  Michel Bickers, MD [facility] [phone] pager   [phone] cell 07/31/2014, 6:04 PM

## 2014-07-31 NOTE — Progress Notes (Signed)
Patient ID: Tammy DubinStephanie A Waibel, female   DOB: 08/20/1960, 54 y.o.   MRN: [id number]

## 2014-08-04 ENCOUNTER — Ambulatory Visit (INDEPENDENT_AMBULATORY_CARE_PROVIDER_SITE_OTHER): Payer: BLUE CROSS/BLUE SHIELD | Admitting: *Deleted

## 2014-08-04 DIAGNOSIS — Z113 Encounter for screening for infections with a predominantly sexual mode of transmission: Secondary | ICD-10-CM | POA: Diagnosis not present

## 2014-08-04 DIAGNOSIS — Z124 Encounter for screening for malignant neoplasm of cervix: Secondary | ICD-10-CM

## 2014-08-04 NOTE — Patient Instructions (Signed)
Your results will be ready in about a week.  I will mail them to you.  Thank you for coming to the Center today.  Angelique Blonderenise, RN

## 2014-08-04 NOTE — Progress Notes (Signed)
  Subjective:     Riccardo DubinStephanie A Augustus is a 54 y.o. woman who comes in today for a  pap smear only.  Previous abnormal Pap smears: yes. Contraception: condoms, menopausal.   Objective:    There were no vitals taken for this visit. Pelvic Exam:  Pap smear obtained.   Assessment:    Screening pap smear.   Plan:    Follow up in one year, or as indicated by Pap results.   Pt given educational materials re: HIV and women, self-esteem, BSE, nutrition and diet management, PAP smears and partner safety. Pt given condoms.

## 2014-08-05 LAB — CYTOLOGY - PAP

## 2014-08-08 ENCOUNTER — Encounter: Payer: Self-pay | Admitting: *Deleted

## 2014-08-31 ENCOUNTER — Other Ambulatory Visit: Payer: Self-pay | Admitting: Internal Medicine

## 2014-08-31 DIAGNOSIS — I10 Essential (primary) hypertension: Secondary | ICD-10-CM

## 2014-09-17 ENCOUNTER — Other Ambulatory Visit: Payer: Self-pay | Admitting: Internal Medicine

## 2014-09-17 DIAGNOSIS — B20 Human immunodeficiency virus [HIV] disease: Secondary | ICD-10-CM

## 2014-10-19 ENCOUNTER — Other Ambulatory Visit: Payer: Self-pay | Admitting: Internal Medicine

## 2014-10-20 ENCOUNTER — Other Ambulatory Visit: Payer: Self-pay | Admitting: Internal Medicine

## 2014-10-21 ENCOUNTER — Other Ambulatory Visit: Payer: Self-pay | Admitting: Internal Medicine

## 2014-11-10 ENCOUNTER — Other Ambulatory Visit: Payer: BLUE CROSS/BLUE SHIELD

## 2014-11-10 ENCOUNTER — Other Ambulatory Visit: Payer: Self-pay | Admitting: *Deleted

## 2014-11-10 DIAGNOSIS — B20 Human immunodeficiency virus [HIV] disease: Secondary | ICD-10-CM

## 2014-11-10 DIAGNOSIS — J309 Allergic rhinitis, unspecified: Secondary | ICD-10-CM

## 2014-11-10 LAB — CBC
HCT: 37.3 % (ref 36.0–46.0)
HEMOGLOBIN: 12.8 g/dL (ref 12.0–15.0)
MCH: 31.8 pg (ref 26.0–34.0)
MCHC: 34.3 g/dL (ref 30.0–36.0)
MCV: 92.6 fL (ref 78.0–100.0)
MPV: 9.2 fL (ref 8.6–12.4)
Platelets: 329 10*3/uL (ref 150–400)
RBC: 4.03 MIL/uL (ref 3.87–5.11)
RDW: 12.9 % (ref 11.5–15.5)
WBC: 6.7 10*3/uL (ref 4.0–10.5)

## 2014-11-10 LAB — COMPREHENSIVE METABOLIC PANEL
ALBUMIN: 4.1 g/dL (ref 3.6–5.1)
ALT: 14 U/L (ref 6–29)
AST: 12 U/L (ref 10–35)
Alkaline Phosphatase: 103 U/L (ref 33–130)
BUN: 8 mg/dL (ref 7–25)
CALCIUM: 8.9 mg/dL (ref 8.6–10.4)
CO2: 24 mmol/L (ref 20–31)
Chloride: 107 mmol/L (ref 98–110)
Creat: 0.88 mg/dL (ref 0.50–1.05)
GLUCOSE: 78 mg/dL (ref 65–99)
POTASSIUM: 4.2 mmol/L (ref 3.5–5.3)
Sodium: 138 mmol/L (ref 135–146)
Total Bilirubin: 0.4 mg/dL (ref 0.2–1.2)
Total Protein: 6.8 g/dL (ref 6.1–8.1)

## 2014-11-10 MED ORDER — FLUNISOLIDE 25 MCG/ACT (0.025%) NA SOLN: 2.0000 | Freq: Two times a day (BID) | NASAL | Status: DC

## 2014-11-11 LAB — HIV-1 RNA QUANT-NO REFLEX-BLD: HIV-1 RNA Quant, Log: <1.3 {Log} (ref <1.30)

## 2014-11-11 LAB — T-HELPER CELL (CD4) - (RCID CLINIC ONLY)
CD4 % Helper T Cell: 16 % — ABNORMAL LOW (ref 33–55)
CD4 T Cell Abs: 360 /uL — ABNORMAL LOW (ref 400–2700)

## 2014-11-14 LAB — HLA B*5701: HLA-B*5701 w/rflx HLA-B High: NEGATIVE

## 2014-11-24 ENCOUNTER — Other Ambulatory Visit: Payer: Self-pay | Admitting: Internal Medicine

## 2014-11-25 ENCOUNTER — Telehealth: Payer: Self-pay | Admitting: *Deleted

## 2014-11-25 ENCOUNTER — Ambulatory Visit: Payer: BLUE CROSS/BLUE SHIELD | Admitting: Internal Medicine

## 2014-11-25 NOTE — Telephone Encounter (Signed)
Requested pt call for new MD appt. 

## 2014-12-23 ENCOUNTER — Ambulatory Visit (INDEPENDENT_AMBULATORY_CARE_PROVIDER_SITE_OTHER): Payer: BLUE CROSS/BLUE SHIELD | Admitting: Internal Medicine

## 2014-12-23 ENCOUNTER — Encounter: Payer: Self-pay | Admitting: Internal Medicine

## 2014-12-23 VITALS — BP 114/79 | HR 73 | Temp 98.3°F | Ht 62.5 in | Wt 133.6 lb

## 2014-12-23 DIAGNOSIS — B20 Human immunodeficiency virus [HIV] disease: Secondary | ICD-10-CM | POA: Diagnosis not present

## 2014-12-23 DIAGNOSIS — Z23 Encounter for immunization: Secondary | ICD-10-CM | POA: Diagnosis not present

## 2014-12-23 NOTE — Progress Notes (Signed)
Patient ID: Tammy Henson, female   DOB: 23-Sep-1960, 54 y.o.   MRN: [id number]          Patient Active Problem List   Diagnosis Date Noted  . Human immunodeficiency virus (HIV) disease (HCC) 12/31/2005    Priority: High  . Anxiety 01/29/2013  . CARPAL TUNNEL SYNDROME 09/10/2009  . 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 DIARRHEA, CHRONIC 07/31/2008  . TINEA VERSICOLOR 12/31/2005  . HYPERLIPIDEMIA 12/31/2005  . ANEMIA-NOS 12/31/2005  . HYPERTENSION 12/31/2005  . ALLERGIC RHINITIS 12/31/2005    Patient's Medications  New Prescriptions   No medications on file  Previous Medications   DARUNAVIR ETHANOLATE (PREZISTA) 800 MG TABLET    Take 1 tablet (800 mg total) by mouth daily with breakfast.   FISH OIL-OMEGA-3 FATTY ACIDS 1000 MG CAPSULE    Take 2 capsules (2 g total) by mouth 2 (two) times daily.   FLUNISOLIDE (NASALIDE) 25 MCG/ACT (0.025%) SOLN    Place 2 sprays into the nose 2 (two) times daily.   LISINOPRIL (PRINIVIL,ZESTRIL) 20 MG TABLET    TAKE ONE TABLET BY MOUTH ONE TIME DAILY   LISINOPRIL (PRINIVIL,ZESTRIL) 20 MG TABLET    TAKE ONE TABLET BY MOUTH ONE TIME DAILY   LORATADINE 10 MG CAPS    Take 1 capsule (10 mg total) by mouth daily.   RITONAVIR (NORVIR) 100 MG TABS TABLET    Take 1 tablet (100 mg total) by mouth daily.   TRUVADA 200-300 MG PER TABLET    TAKE 1 TABLET BY MOUTH ONCE A DAY  Modified Medications   No medications on file  Discontinued Medications   No medications on file    Subjective: Tammy Henson is in for her routine HIV follow-up visit. She denies missing any doses of her Truvada, Prezista or Norvir. She is not aware of having any side effects. She is doing well and denies any current concerns about her health. She does state that her insurance will be changing from [employer] to [employer] as of January 1. She is concerned that her medications may become more expensive.   Review of Systems: Pertinent items are noted in HPI.  No past medical history  on file.  Social History  Substance Use Topics  . Smoking status: Never Smoker   . Smokeless tobacco: Never Used  . Alcohol Use: No    No family history on file.  No Known Allergies  Objective:  Filed Vitals:   12/23/14 1601  BP: 114/79  Pulse: 73  Temp: 98.3 F (36.8 C)  TempSrc: Oral  Height: 5' 2.5" (1.588 m)  Weight: 133 lb 9.6 oz (60.601 kg)   Body mass index is 24.03 kg/(m^2).  General: Her weight is stable. Oral: No oropharyngeal lesions Skin: No rash Lungs: Clear Cor: Regular S1 and S2 with no murmur Mood: She does not appear anxious or depressed  Lab Results Lab Results  Component Value Date   WBC 6.7 11/10/2014   HGB 12.8 11/10/2014   HCT 37.3 11/10/2014   MCV 92.6 11/10/2014   PLT 329 11/10/2014    Lab Results  Component Value Date   CREATININE 0.88 11/10/2014   BUN 8 11/10/2014   NA 138 11/10/2014   K 4.2 11/10/2014   CL 107 11/10/2014   CO2 24 11/10/2014    Lab Results  Component Value Date   ALT 14 11/10/2014   AST 12 11/10/2014   ALKPHOS 103 11/10/2014   BILITOT 0.4 11/10/2014    Lab Results  Component  Value Date   CHOL 164 07/14/2014   HDL 61 07/14/2014   LDLCALC 70 07/14/2014   TRIG 166* 07/14/2014   CHOLHDL 2.7 07/14/2014    Lab Results HIV 1 RNA QUANT (copies/mL)  Date Value  11/10/2014 <20  07/14/2014 <20  09/11/2013 <20   CD4 T CELL ABS (/uL)  Date Value  11/10/2014 360*  07/14/2014 560  09/11/2013 520     Problem List Items Addressed This Visit      High   Human immunodeficiency virus (HIV) disease (HCC) - Primary    Her HIV infection remains under very good control with long-term viral suppression. I do not have any information to suggest that she is ever had resistance mutations documented. I have reviewed potential options for simplifying her regimen and making it safer. Given that her insurance will be changing soon I will have her return in 3 months to consider switching to a 1 pill a day regimen versus  Descovy and Prezcobix.           Cliffton AstersJohn Tammy Haffey, MD [facility] [phone] pager   [phone] cell 12/23/2014, 4:29 PM

## 2014-12-23 NOTE — Assessment & Plan Note (Signed)
Her HIV infection remains under very good control with long-term viral suppression. I do not have any information to suggest that she is ever had resistance mutations documented. I have reviewed potential options for simplifying her regimen and making it safer. Given that her insurance will be changing soon I will have her return in 3 months to consider switching to a 1 pill a day regimen versus Descovy and Prezcobix.

## 2015-01-28 ENCOUNTER — Other Ambulatory Visit: Payer: Self-pay | Admitting: *Deleted

## 2015-01-28 DIAGNOSIS — I10 Essential (primary) hypertension: Secondary | ICD-10-CM

## 2015-01-28 MED ORDER — LISINOPRIL 20 MG PO TABS: 20.0000 mg | ORAL_TABLET | Freq: Every day | ORAL | Status: DC

## 2015-02-22 ENCOUNTER — Other Ambulatory Visit: Payer: Self-pay | Admitting: Internal Medicine

## 2015-03-13 ENCOUNTER — Other Ambulatory Visit: Payer: Self-pay | Admitting: Internal Medicine

## 2015-03-23 ENCOUNTER — Ambulatory Visit: Payer: BLUE CROSS/BLUE SHIELD | Admitting: Internal Medicine

## 2015-04-07 ENCOUNTER — Encounter: Payer: Self-pay | Admitting: Internal Medicine

## 2015-04-07 ENCOUNTER — Ambulatory Visit (INDEPENDENT_AMBULATORY_CARE_PROVIDER_SITE_OTHER): Payer: Managed Care, Other (non HMO) | Admitting: Internal Medicine

## 2015-04-07 DIAGNOSIS — B20 Human immunodeficiency virus [HIV] disease: Secondary | ICD-10-CM

## 2015-04-07 LAB — COMPREHENSIVE METABOLIC PANEL
ALK PHOS: 96 U/L (ref 33–130)
ALT: 13 U/L (ref 6–29)
AST: 13 U/L (ref 10–35)
Albumin: 4 g/dL (ref 3.6–5.1)
BUN: 14 mg/dL (ref 7–25)
CHLORIDE: 104 mmol/L (ref 98–110)
CO2: 23 mmol/L (ref 20–31)
Calcium: 8.7 mg/dL (ref 8.6–10.4)
Creat: 1.02 mg/dL (ref 0.50–1.05)
GLUCOSE: 86 mg/dL (ref 65–99)
Potassium: 3.8 mmol/L (ref 3.5–5.3)
SODIUM: 137 mmol/L (ref 135–146)
TOTAL PROTEIN: 6.9 g/dL (ref 6.1–8.1)
Total Bilirubin: 0.5 mg/dL (ref 0.2–1.2)

## 2015-04-07 LAB — CBC
HCT: 38.6 % (ref 36.0–46.0)
Hemoglobin: 12.9 g/dL (ref 12.0–15.0)
MCH: 31.5 pg (ref 26.0–34.0)
MCHC: 33.4 g/dL (ref 30.0–36.0)
MCV: 94.4 fL (ref 78.0–100.0)
MPV: 9.4 fL (ref 8.6–12.4)
PLATELETS: 303 10*3/uL (ref 150–400)
RBC: 4.09 MIL/uL (ref 3.87–5.11)
RDW: 13.1 % (ref 11.5–15.5)
WBC: 7.7 10*3/uL (ref 4.0–10.5)

## 2015-04-07 LAB — LIPID PANEL
Cholesterol: 170 mg/dL (ref 125–200)
HDL: 60 mg/dL (ref >=46)
LDL CALC: 69 mg/dL (ref <130)
Total CHOL/HDL Ratio: 2.8 Ratio (ref <=5.0)
Triglycerides: 204 mg/dL — ABNORMAL HIGH (ref <150)
VLDL: 41 mg/dL — ABNORMAL HIGH (ref <30)

## 2015-04-07 MED ORDER — DARUNAVIR-COBICISTAT 800-150 MG PO TABS: 1.0000 | ORAL_TABLET | Freq: Every day | ORAL | Status: DC

## 2015-04-07 MED ORDER — EMTRICITABINE-TENOFOVIR AF 200-25 MG PO TABS: 1.0000 | ORAL_TABLET | Freq: Every day | ORAL | Status: DC

## 2015-04-07 NOTE — Progress Notes (Signed)
Patient Active Problem List   Diagnosis Date Noted  . Human immunodeficiency virus (HIV) disease (HCC) 12/31/2005    Priority: High  . Anxiety 01/29/2013  . CARPAL TUNNEL SYNDROME 09/10/2009  . 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 DIARRHEA, CHRONIC 07/31/2008  . TINEA VERSICOLOR 12/31/2005  . HYPERLIPIDEMIA 12/31/2005  . ANEMIA-NOS 12/31/2005  . HYPERTENSION 12/31/2005  . ALLERGIC RHINITIS 12/31/2005    Patient's Medications  New Prescriptions   DARUNAVIR-COBICISTAT (PREZCOBIX) 800-150 MG TABLET    Take 1 tablet by mouth daily. Swallow whole. Do NOT crush, break or chew tablets. Take with food.   EMTRICITABINE-TENOFOVIR AF (DESCOVY) 200-25 MG TABLET    Take 1 tablet by mouth daily.  Previous Medications   FISH OIL-OMEGA-3 FATTY ACIDS 1000 MG CAPSULE    Take 2 capsules (2 g total) by mouth 2 (two) times daily.   FLUNISOLIDE (NASALIDE) 25 MCG/ACT (0.025%) SOLN    Place 2 sprays into the nose 2 (two) times daily.   LISINOPRIL (PRINIVIL,ZESTRIL) 20 MG TABLET    Take 1 tablet (20 mg total) by mouth daily.   LORATADINE 10 MG CAPS    Take 1 capsule (10 mg total) by mouth daily.  Modified Medications   No medications on file  Discontinued Medications   NORVIR 100 MG TABS TABLET    TAKE 1 TABLET BY MOUTH EVERY DAY   PREZISTA 800 MG TABLET    TAKE 1 TABLET BY MOUTH EVERY MORNING WITH BREKFAST   TRUVADA 200-300 MG TABLET    TAKE 1 TABLET BY MOUTH EVERY DAY    Subjective: Tammy Henson is in for her routine HIV follow-up visit. She has had some traumatic issues with her pets recently. One dog died and she recently found out that another is having problems causing the dog to drag one leg. 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 She will be undergoing further surgery. Fortunately, she's had no problems with her transition to [employer]. Her co-pays remain manageable. She has not missed any doses of her Truvada, Prezista or Norvir. She tolerates her  medications well.   Review of Systems: Review of Systems  Constitutional: Negative for fever, chills, weight loss, malaise/fatigue and diaphoresis.  HENT: Negative for sore throat.        Her right jaw pain is improving.  Respiratory: Negative for cough, sputum production and shortness of breath.   Cardiovascular: Negative for chest pain.  Gastrointestinal: Negative for nausea, vomiting and diarrhea.  Musculoskeletal: Negative for joint pain.  Skin: Negative for rash.  Psychiatric/Behavioral: Negative for depression.    No past medical history on file.  Social History  Substance Use Topics  . Smoking status: Never Smoker   . Smokeless tobacco: Never Used  . Alcohol Use: No    No family history on file.  No Known Allergies  Objective:  Filed Vitals:   04/07/15 0851  BP: 97/63  Pulse: 72  Temp: 97.7 F (36.5 C)  TempSrc: Oral  Weight: 132 lb 8 oz (60.102 kg)   Body mass index is 23.83 kg/(m^2).  Physical Exam  Constitutional: She is oriented to person, place, and time. No distress.  HENT:  Mouth/Throat: No oropharyngeal exudate.  Eyes: Conjunctivae are normal.  Cardiovascular: Normal rate and regular rhythm.   No murmur heard. Pulmonary/Chest: Breath sounds normal.  Abdominal: Soft.  Neurological: She is alert and oriented to person, place, and time.  Skin: No rash noted.  Psychiatric:  Mood and affect normal.    Lab Results Lab Results  Component Value Date   WBC 6.7 11/10/2014   HGB 12.8 11/10/2014   HCT 37.3 11/10/2014   MCV 92.6 11/10/2014   PLT 329 11/10/2014    Lab Results  Component Value Date   CREATININE 0.88 11/10/2014   BUN 8 11/10/2014   NA 138 11/10/2014   K 4.2 11/10/2014   CL 107 11/10/2014   CO2 24 11/10/2014    Lab Results  Component Value Date   ALT 14 11/10/2014   AST 12 11/10/2014   ALKPHOS 103 11/10/2014   BILITOT 0.4 11/10/2014    Lab Results  Component Value Date   CHOL 164 07/14/2014   HDL 61 07/14/2014   LDLCALC  70 07/14/2014   TRIG 166* 07/14/2014   CHOLHDL 2.7 07/14/2014    Lab Results HIV 1 RNA QUANT (copies/mL)  Date Value  11/10/2014 <20  07/14/2014 <20  09/11/2013 <20   CD4 T CELL ABS (/uL)  Date Value  11/10/2014 360*  07/14/2014 560  09/11/2013 520      Problem List Items Addressed This Visit      High   Human immunodeficiency virus (HIV) disease (HCC)    Her HIV infection remains under excellent control. I will change her to a simpler and safer regimen of Descovy and Prezcobix. She will get repeat lab work today and follow-up with me in 6 months.      Relevant Medications   emtricitabine-tenofovir AF (DESCOVY) 200-25 MG tablet   darunavir-cobicistat (PREZCOBIX) 800-150 MG tablet   Other Relevant Orders   T-helper cell (CD4)- ([facility] clinic only)   HIV 1 RNA quant-no reflex-bld   CBC   Comprehensive metabolic panel   RPR   Lipid panel        Tammy Asters, MD Tammy Henson [facility] for Infectious Disease [facility] [phone] pager   [phone] cell 04/07/2015, 9:10 AM

## 2015-04-07 NOTE — Assessment & Plan Note (Signed)
Her HIV infection remains under excellent control. I will change her to a simpler and safer regimen of Descovy and Prezcobix. She will get repeat lab work today and follow-up with me in 6 months.

## 2015-04-08 LAB — HIV-1 RNA QUANT-NO REFLEX-BLD

## 2015-04-08 LAB — T-HELPER CELL (CD4) - (RCID CLINIC ONLY)
CD4 % Helper T Cell: 19 % — ABNORMAL LOW (ref 33–55)
CD4 T Cell Abs: 450 /uL (ref 400–2700)

## 2015-04-08 LAB — RPR

## 2015-07-31 ENCOUNTER — Ambulatory Visit (INDEPENDENT_AMBULATORY_CARE_PROVIDER_SITE_OTHER): Payer: Managed Care, Other (non HMO) | Admitting: *Deleted

## 2015-07-31 DIAGNOSIS — Z113 Encounter for screening for infections with a predominantly sexual mode of transmission: Secondary | ICD-10-CM | POA: Diagnosis not present

## 2015-07-31 DIAGNOSIS — Z124 Encounter for screening for malignant neoplasm of cervix: Secondary | ICD-10-CM

## 2015-07-31 NOTE — Patient Instructions (Signed)
Your results will be ready in about a week.  I will send them to you in MyChart.  Thank you for coming to the Center for your care.  Demetrus Pavao, RN 

## 2015-07-31 NOTE — Progress Notes (Signed)
  Subjective:     Tammy DubinStephanie A Henson is a 55 y.o. woman who comes in today for a  pap smear only.  Previous abnormal Pap smears: no. Contraception: condoms  Objective:    There were no vitals taken for this visit. Pelvic Exam: Pap smear obtained.   Assessment:    Screening pap smear.   Plan:    Follow up in one year, or as indicated by Pap results.  Pt given educational materials re: HIV and women, self-esteem, BSE, nutrition and diet management, PAP smears and partner safety. Pt given condoms.

## 2015-08-03 LAB — CERVICOVAGINAL ANCILLARY ONLY
Chlamydia: NEGATIVE
NEISSERIA GONORRHEA: NEGATIVE

## 2015-08-03 LAB — CYTOLOGY - PAP

## 2015-08-05 ENCOUNTER — Encounter: Payer: Self-pay | Admitting: *Deleted

## 2015-09-28 ENCOUNTER — Encounter: Payer: Self-pay | Admitting: Internal Medicine

## 2015-09-28 ENCOUNTER — Ambulatory Visit: Payer: Managed Care, Other (non HMO) | Admitting: *Deleted

## 2015-09-28 ENCOUNTER — Ambulatory Visit (INDEPENDENT_AMBULATORY_CARE_PROVIDER_SITE_OTHER): Payer: Managed Care, Other (non HMO) | Admitting: Internal Medicine

## 2015-09-28 DIAGNOSIS — B20 Human immunodeficiency virus [HIV] disease: Secondary | ICD-10-CM | POA: Diagnosis not present

## 2015-09-28 DIAGNOSIS — F329 Major depressive disorder, single episode, unspecified: Secondary | ICD-10-CM | POA: Diagnosis not present

## 2015-09-28 DIAGNOSIS — F32A Depression, unspecified: Secondary | ICD-10-CM

## 2015-09-28 DIAGNOSIS — E881 Lipodystrophy, not elsewhere classified: Secondary | ICD-10-CM | POA: Diagnosis not present

## 2015-09-28 NOTE — Assessment & Plan Note (Signed)
She is tolerating her new, simplified regimen and her adherence is excellent. Her infection has been under excellent long-term control. I will repeat blood work today and have her follow-up in 6 months.

## 2015-09-28 NOTE — Assessment & Plan Note (Signed)
I encouraged her to increase her exercise in an effort to deal with her central belly fat.

## 2015-09-28 NOTE — Progress Notes (Signed)
Patient Active Problem List   Diagnosis Date Noted  . Human immunodeficiency virus (HIV) disease (HCC) 12/31/2005    Priority: High  . Depression 09/28/2015  . Anxiety 01/29/2013  . CARPAL TUNNEL SYNDROME 09/10/2009  . 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 DIARRHEA, CHRONIC 07/31/2008  . TINEA VERSICOLOR 12/31/2005  . HYPERLIPIDEMIA 12/31/2005  . ANEMIA-NOS 12/31/2005  . HYPERTENSION 12/31/2005  . ALLERGIC RHINITIS 12/31/2005    Patient's Medications  New Prescriptions   No medications on file  Previous Medications   CETIRIZINE (ZYRTEC) 10 MG TABLET    Take 10 mg by mouth daily.   DARUNAVIR-COBICISTAT (PREZCOBIX) 800-150 MG TABLET    Take 1 tablet by mouth daily. Swallow whole. Do NOT crush, break or chew tablets. Take with food.   EMTRICITABINE-TENOFOVIR AF (DESCOVY) 200-25 MG TABLET    Take 1 tablet by mouth daily.   FISH OIL-OMEGA-3 FATTY ACIDS 1000 MG CAPSULE    Take 2 capsules (2 g total) by mouth 2 (two) times daily.   FLUNISOLIDE (NASALIDE) 25 MCG/ACT (0.025%) SOLN    Place 2 sprays into the nose 2 (two) times daily.   LISINOPRIL (PRINIVIL,ZESTRIL) 20 MG TABLET    Take 1 tablet (20 mg total) by mouth daily.   LORATADINE 10 MG CAPS    Take 1 capsule (10 mg total) by mouth daily.  Modified Medications   No medications on file  Discontinued Medications   No medications on file    Subjective: Man is in for her routine follow-up visit. She's had no problems tolerating the switch to Descovy and Prezcobix. She is missed only one dose in the past 6 months when her pharmacy was late with a refill. She remains under a great deal of stress and feels frustrated. She is worried that her daughter is not taking care of herself. Her boyfriend injured his knee and has been out of work creating financial difficulties for them. She wants to quit her job and find new work but knows that this would add further financial burdens. She's also been concerned about her belly fat and wants  to know if there something that can be done about it.  Review of Systems: Review of Systems  Constitutional: Positive for malaise/fatigue. Negative for chills, fever and weight loss.  HENT: Negative for sore throat.   Respiratory: Negative for cough, sputum production and shortness of breath.   Cardiovascular: Negative for chest pain.  Gastrointestinal: Negative for abdominal pain, diarrhea, nausea and vomiting.       Bloating.  Genitourinary: Negative for dysuria.  Musculoskeletal: Positive for joint pain. Negative for myalgias.  Skin: Negative for rash.  Neurological: Negative for sensory change and headaches.  Psychiatric/Behavioral: Positive for depression. Negative for substance abuse. The patient is not nervous/anxious.     No past medical history on file.  Social History  Substance Use Topics  . Smoking status: Never Smoker  . Smokeless tobacco: Never Used  . Alcohol use No    No family history on file.  No Known Allergies  Objective:  Vitals:   09/28/15 1356  BP: 112/74  Pulse: 80  Temp: 98.2 F (36.8 C)  TempSrc: Oral  Weight: 135 lb 8 oz (61.5 kg)   Body mass index is 24.39 kg/m.  Physical Exam  Constitutional: She is oriented to person, place, and time. No distress.  HENT:  Mouth/Throat: No oropharyngeal exudate.  Cardiovascular: Normal rate and regular rhythm.   No murmur heard. Pulmonary/Chest: Effort normal  and breath sounds normal.  Abdominal: Soft. There is no tenderness.  She has central adiposity.  Musculoskeletal: Normal range of motion. She exhibits no edema or deformity.  Neurological: She is alert and oriented to person, place, and time.  Skin: No rash noted.  Psychiatric: Mood and affect normal.  She became tearful when talking about her daughter.    Lab Results Lab Results  Component Value Date   WBC 7.7 04/07/2015   HGB 12.9 04/07/2015   HCT 38.6 04/07/2015   MCV 94.4 04/07/2015   PLT 303 04/07/2015    Lab Results    Component Value Date   CREATININE 1.02 04/07/2015   BUN 14 04/07/2015   NA 137 04/07/2015   K 3.8 04/07/2015   CL 104 04/07/2015   CO2 23 04/07/2015    Lab Results  Component Value Date   ALT 13 04/07/2015   AST 13 04/07/2015   ALKPHOS 96 04/07/2015   BILITOT 0.5 04/07/2015    Lab Results  Component Value Date   CHOL 170 04/07/2015   HDL 60 04/07/2015   LDLCALC 69 04/07/2015   TRIG 204 (H) 04/07/2015   CHOLHDL 2.8 04/07/2015   HIV 1 RNA Quant (copies/mL)  Date Value  04/07/2015 <20  11/10/2014 <20  07/14/2014 <20   CD4 T Cell Abs (/uL)  Date Value  04/07/2015 450  11/10/2014 360 (L)  07/14/2014 560     Problem List Items Addressed This Visit      High   Human immunodeficiency virus (HIV) disease (HCC)    She is tolerating her new, simplified regimen and her adherence is excellent. Her infection has been under excellent long-term control. I will repeat blood work today and have her follow-up in 6 months.        Unprioritized   Depression    I suspect she is still dealing with some chronic, smoldering depression that she describes as frustration and feeling of being overwhelmed. I had her meet with our behavioral health counselor, Jenel Lucks, today.      LIPODYSTROPHY    I encouraged her to increase her exercise in an effort to deal with her central belly fat.       Other Visit Diagnoses   None.       Cliffton Asters, MD [facility] Disease [facility] [phone] pager   [phone] cell 09/28/2015, 2:53 PM

## 2015-09-28 NOTE — BH Specialist Note (Signed)
Counselor met with Timothea today in the exam room for a warm hand off.  Patient was oriented times four with good affect and dress.  Patient was oriented times four with good affect and dress/  Patient was alert but appeared a bit anxious to get going.  Patient shared that she did not need any counseling right now.  Counselor provided support for patient and recommended that she use the services if and when she ever had a need.  Patient said ok.   Rolena Infante, MA,LPC [facility]

## 2015-09-28 NOTE — Assessment & Plan Note (Signed)
I suspect she is still dealing with some chronic, smoldering depression that she describes as frustration and feeling of being overwhelmed. I had her meet with our behavioral health counselor, Jenel Lucks, today.

## 2015-12-23 ENCOUNTER — Other Ambulatory Visit: Payer: Self-pay | Admitting: Internal Medicine

## 2015-12-23 DIAGNOSIS — I10 Essential (primary) hypertension: Secondary | ICD-10-CM

## 2016-04-17 ENCOUNTER — Other Ambulatory Visit: Payer: Self-pay | Admitting: Internal Medicine

## 2016-04-17 DIAGNOSIS — B20 Human immunodeficiency virus [HIV] disease: Secondary | ICD-10-CM

## 2016-06-06 ENCOUNTER — Other Ambulatory Visit: Payer: Managed Care, Other (non HMO)

## 2016-06-06 DIAGNOSIS — B20 Human immunodeficiency virus [HIV] disease: Secondary | ICD-10-CM

## 2016-06-06 LAB — COMPREHENSIVE METABOLIC PANEL
ALBUMIN: 4.2 g/dL (ref 3.6–5.1)
ALT: 10 U/L (ref 6–29)
AST: 11 U/L (ref 10–35)
Alkaline Phosphatase: 75 U/L (ref 33–130)
BUN: 16 mg/dL (ref 7–25)
CALCIUM: 9.1 mg/dL (ref 8.6–10.4)
CHLORIDE: 102 mmol/L (ref 98–110)
CO2: 26 mmol/L (ref 20–31)
Creat: 0.79 mg/dL (ref 0.50–1.05)
GLUCOSE: 116 mg/dL — AB (ref 65–99)
POTASSIUM: 4.1 mmol/L (ref 3.5–5.3)
Sodium: 139 mmol/L (ref 135–146)
Total Bilirubin: 0.3 mg/dL (ref 0.2–1.2)
Total Protein: 7.2 g/dL (ref 6.1–8.1)

## 2016-06-06 LAB — CBC
HCT: 40.2 % (ref 35.0–45.0)
Hemoglobin: 13.4 g/dL (ref 11.7–15.5)
MCH: 31.5 pg (ref 27.0–33.0)
MCHC: 33.3 g/dL (ref 32.0–36.0)
MCV: 94.4 fL (ref 80.0–100.0)
MPV: 9.2 fL (ref 7.5–12.5)
Platelets: 289 10*3/uL (ref 140–400)
RBC: 4.26 MIL/uL (ref 3.80–5.10)
RDW: 13.6 % (ref 11.0–15.0)
WBC: 8.3 10*3/uL (ref 3.8–10.8)

## 2016-06-06 LAB — LIPID PANEL
CHOL/HDL RATIO: 3 ratio (ref <5.0)
Cholesterol: 175 mg/dL (ref <200)
HDL: 59 mg/dL (ref >50)
LDL CALC: 49 mg/dL (ref <100)
TRIGLYCERIDES: 335 mg/dL — AB (ref <150)
VLDL: 67 mg/dL — AB (ref <30)

## 2016-06-07 LAB — T-HELPER CELL (CD4) - (RCID CLINIC ONLY)
CD4 T CELL ABS: 540 /uL (ref 400–2700)
CD4 T CELL HELPER: 22 % — AB (ref 33–55)

## 2016-06-07 LAB — RPR

## 2016-06-08 LAB — HIV-1 RNA QUANT-NO REFLEX-BLD
HIV 1 RNA QUANT: NOT DETECTED {copies}/mL
HIV-1 RNA QUANT, LOG: NOT DETECTED {Log_copies}/mL

## 2016-06-14 ENCOUNTER — Ambulatory Visit: Payer: Managed Care, Other (non HMO) | Admitting: Internal Medicine

## 2016-06-20 ENCOUNTER — Encounter: Payer: Self-pay | Admitting: Internal Medicine

## 2016-06-20 ENCOUNTER — Ambulatory Visit (INDEPENDENT_AMBULATORY_CARE_PROVIDER_SITE_OTHER): Payer: Managed Care, Other (non HMO) | Admitting: Internal Medicine

## 2016-06-20 DIAGNOSIS — F325 Major depressive disorder, single episode, in full remission: Secondary | ICD-10-CM | POA: Diagnosis not present

## 2016-06-20 DIAGNOSIS — B20 Human immunodeficiency virus [HIV] disease: Secondary | ICD-10-CM

## 2016-06-20 DIAGNOSIS — J301 Allergic rhinitis due to pollen: Secondary | ICD-10-CM | POA: Diagnosis not present

## 2016-06-20 NOTE — Progress Notes (Signed)
Patient Active Problem List   Diagnosis Date Noted  . Human immunodeficiency virus (HIV) disease (HCC) 12/31/2005    Priority: High  . Depression 09/28/2015  . Anxiety 01/29/2013  . CARPAL TUNNEL SYNDROME 09/10/2009  . 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 DIARRHEA, CHRONIC 07/31/2008  . TINEA VERSICOLOR 12/31/2005  . HYPERLIPIDEMIA 12/31/2005  . ANEMIA-NOS 12/31/2005  . HYPERTENSION 12/31/2005  . Allergic rhinitis 12/31/2005    Patient's Medications  New Prescriptions   No medications on file  Previous Medications   CETIRIZINE (ZYRTEC) 10 MG TABLET    Take 10 mg by mouth daily.   DESCOVY 200-25 MG TABLET    TAKE 1 TABLET BY MOUTH DAILY.   FISH OIL-OMEGA-3 FATTY ACIDS 1000 MG CAPSULE    Take 2 capsules (2 g total) by mouth 2 (two) times daily.   FLUNISOLIDE (NASALIDE) 25 MCG/ACT (0.025%) SOLN    Place 2 sprays into the nose 2 (two) times daily.   LISINOPRIL (PRINIVIL,ZESTRIL) 20 MG TABLET    TAKE 1 TABLET (20 MG TOTAL) BY MOUTH DAILY.__INS ONLY ALLOW 30 DAYS__   LORATADINE 10 MG CAPS    Take 1 capsule (10 mg total) by mouth daily.   PREZCOBIX 800-150 MG TABLET    TAKE 1 TABLET BY MOUTH DAILY. SWALLOW WHOLE. DO NOT CRUSH, BREAK OR CHEW TABLETS. TAKE WITH FOOD.  Modified Medications   No medications on file  Discontinued Medications   No medications on file    Subjective: Tammy Henson is in for her routine HIV follow-up visit. She's had no problems obtaining, taking or tolerating her Descovy and Prezcobix. She takes it each evening when she gets home from work. She denies missing any doses. She recently moved [place] and now has a 30 minute commute both ways to work. She still finds her work very stressful and has been contemplating trying to find a new job closer to home. Her daughter, who is also HIV infected, has been off of her medication for the past year. She will be coming here for a visit next week. She is bothered by her seasonal allergies now and wants to know if she can  take Zyrtec. She is not getting any regular exercise.   Review of Systems: Review of Systems  Constitutional: Negative for chills, diaphoresis, fever, malaise/fatigue and weight loss.  HENT: Positive for congestion. Negative for sore throat.   Eyes: Positive for redness.  Respiratory: Negative for cough, sputum production and shortness of breath.   Cardiovascular: Negative for chest pain.  Gastrointestinal: Negative for abdominal pain, diarrhea, heartburn, nausea and vomiting.  Genitourinary: Negative for dysuria and frequency.  Musculoskeletal: Negative for joint pain and myalgias.  Skin: Negative for rash.  Neurological: Negative for dizziness and headaches.  Psychiatric/Behavioral: Negative for depression and substance abuse. The patient is not nervous/anxious.     No past medical history on file.  Social History  Substance Use Topics  . Smoking status: Never Smoker  . Smokeless tobacco: Never Used  . Alcohol use No    No family history on file.  No Known Allergies  Objective:  Vitals:   06/20/16 1440  BP: 102/67  Pulse: 88  Temp: 97.9 F (36.6 C)  TempSrc: Oral  Weight: 136 lb (61.7 kg)   Body mass index is 24.48 kg/m.  Physical Exam  Constitutional: She is oriented to person, place, and time.  She is in good spirits.  HENT:  Mouth/Throat: No oropharyngeal exudate.  Eyes: Conjunctivae are normal.  Cardiovascular: Normal rate and regular rhythm.   No murmur heard. Pulmonary/Chest: Breath sounds normal.  Abdominal: Soft. She exhibits no mass. There is no tenderness.  Musculoskeletal: Normal range of motion.  Neurological: She is alert and oriented to person, place, and time.  Skin: No rash noted.  Psychiatric: Mood and affect normal.    Lab Results Lab Results  Component Value Date   WBC 8.3 06/06/2016   HGB 13.4 06/06/2016   HCT 40.2 06/06/2016   MCV 94.4 06/06/2016   PLT 289 06/06/2016    Lab Results  Component Value Date   CREATININE 0.79  06/06/2016   BUN 16 06/06/2016   NA 139 06/06/2016   K 4.1 06/06/2016   CL 102 06/06/2016   CO2 26 06/06/2016    Lab Results  Component Value Date   ALT 10 06/06/2016   AST 11 06/06/2016   ALKPHOS 75 06/06/2016   BILITOT 0.3 06/06/2016    Lab Results  Component Value Date   CHOL 175 06/06/2016   HDL 59 06/06/2016   LDLCALC 49 06/06/2016   TRIG 335 (H) 06/06/2016   CHOLHDL 3.0 06/06/2016   HIV 1 RNA Quant (copies/mL)  Date Value  06/06/2016 <20 NOT DETECTED  04/07/2015 <20  11/10/2014 <20   CD4 T Cell Abs (/uL)  Date Value  06/06/2016 540  04/07/2015 450  11/10/2014 360 (L)     Problem List Items Addressed This Visit      High   Human immunodeficiency virus (HIV) disease (HCC)    Her infection is under excellent long-term control. She will continue Descovy and Prezcobix in follow-up after lab work in one year.      Relevant Orders   T-helper cell (CD4)- ([facility] clinic only)   HIV 1 RNA quant-no reflex-bld     Unprioritized   Allergic rhinitis    I told her it would be fine to try Zyrtec for her allergies.      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 She still describes a great deal of stress related to her job and her daughter but she seems more upbeat this visit and is very glad that her daughter will be reentering care.           Cliffton Asters, MD [facility] [phone] pager   [phone] cell 06/20/2016, 3:15 PM

## 2016-06-20 NOTE — Assessment & Plan Note (Signed)
I told her it would be fine to try Zyrtec for her allergies.

## 2016-06-20 NOTE — Assessment & Plan Note (Signed)
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 She still describes a great deal of stress related to her job and her daughter but she seems more upbeat this visit and is very glad that her daughter will be reentering care.

## 2016-06-20 NOTE — Assessment & Plan Note (Signed)
Her infection is under excellent long-term control. She will continue Descovy and Prezcobix in follow-up after lab work in one year.

## 2016-09-13 ENCOUNTER — Other Ambulatory Visit: Payer: Self-pay | Admitting: Internal Medicine

## 2016-09-13 DIAGNOSIS — I10 Essential (primary) hypertension: Secondary | ICD-10-CM

## 2017-02-09 ENCOUNTER — Other Ambulatory Visit: Payer: Self-pay | Admitting: *Deleted

## 2017-02-09 DIAGNOSIS — I10 Essential (primary) hypertension: Secondary | ICD-10-CM

## 2017-02-09 DIAGNOSIS — B20 Human immunodeficiency virus [HIV] disease: Secondary | ICD-10-CM

## 2017-02-09 MED ORDER — DARUNAVIR-COBICISTAT 800-150 MG PO TABS: 1.0000 | ORAL_TABLET | Freq: Every day | ORAL | 1 refills | Status: DC

## 2017-02-09 MED ORDER — EMTRICITABINE-TENOFOVIR AF 200-25 MG PO TABS: 1.0000 | ORAL_TABLET | Freq: Every day | ORAL | 1 refills | Status: DC

## 2017-02-09 MED ORDER — LISINOPRIL 20 MG PO TABS: ORAL_TABLET | ORAL | 1 refills | Status: DC

## 2017-02-13 ENCOUNTER — Other Ambulatory Visit: Payer: Self-pay | Admitting: *Deleted

## 2017-02-13 DIAGNOSIS — B20 Human immunodeficiency virus [HIV] disease: Secondary | ICD-10-CM

## 2017-05-29 ENCOUNTER — Other Ambulatory Visit: Payer: Managed Care, Other (non HMO)

## 2017-05-29 DIAGNOSIS — B20 Human immunodeficiency virus [HIV] disease: Secondary | ICD-10-CM

## 2017-05-30 LAB — T-HELPER CELL (CD4) - (RCID CLINIC ONLY)
CD4 % Helper T Cell: 23 % — ABNORMAL LOW (ref 33–55)
CD4 T Cell Abs: 620 /uL (ref 400–2700)

## 2017-05-31 LAB — HIV-1 RNA QUANT-NO REFLEX-BLD
HIV 1 RNA Quant: <20 copies/mL
HIV-1 RNA Quant, Log: <1.3 Log copies/mL

## 2017-06-12 ENCOUNTER — Ambulatory Visit (INDEPENDENT_AMBULATORY_CARE_PROVIDER_SITE_OTHER): Payer: Managed Care, Other (non HMO) | Admitting: Internal Medicine

## 2017-06-12 ENCOUNTER — Encounter: Payer: Self-pay | Admitting: Internal Medicine

## 2017-06-12 DIAGNOSIS — B20 Human immunodeficiency virus [HIV] disease: Secondary | ICD-10-CM

## 2017-06-12 MED ORDER — DARUN-COBIC-EMTRICIT-TENOFAF 800-150-200-10 MG PO TABS: 1.0000 | ORAL_TABLET | Freq: Every day | ORAL | 11 refills | Status: DC

## 2017-06-12 NOTE — Assessment & Plan Note (Signed)
Her infection remains under excellent, long-term control.  If her Tammy AuerbachCigna plan allows I will consolidate her antiretroviral therapy to Symtuza.  She will follow-up after lab work in 1 year.

## 2017-06-12 NOTE — Progress Notes (Addendum)
Patient Active Problem List   Diagnosis Date Noted  . Human immunodeficiency virus (HIV) disease (HCC) 12/31/2005    Priority: High  . Depression 09/28/2015  . Anxiety 01/29/2013  . CARPAL TUNNEL SYNDROME 09/10/2009  . 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 DIARRHEA, CHRONIC 07/31/2008  . TINEA VERSICOLOR 12/31/2005  . HYPERLIPIDEMIA 12/31/2005  . ANEMIA-NOS 12/31/2005  . HYPERTENSION 12/31/2005  . Allergic rhinitis 12/31/2005    Patient's Medications  New Prescriptions   DARUNAVIR-COBICISCTAT-EMTRICITABINE-TENOFOVIR ALAFENAMIDE (SYMTUZA) 800-150-200-10 MG TABS    Take 1 tablet by mouth daily with breakfast.  Previous Medications   CETIRIZINE (ZYRTEC) 10 MG TABLET    Take 10 mg by mouth daily.   DARUNAVIR-COBICISTAT (PREZCOBIX) 800-150 MG TABLET    Take 1 tablet by mouth daily. Swallow whole. Do NOT crush, break or chew tablets. Take with food.   EMTRICITABINE-TENOFOVIR AF (DESCOVY) 200-25 MG TABLET    Take 1 tablet by mouth daily.   FISH OIL-OMEGA-3 FATTY ACIDS 1000 MG CAPSULE    Take 2 capsules (2 g total) by mouth 2 (two) times daily.   FLUNISOLIDE (NASALIDE) 25 MCG/ACT (0.025%) SOLN    Place 2 sprays into the nose 2 (two) times daily.   LISINOPRIL (PRINIVIL,ZESTRIL) 20 MG TABLET    TAKE 1 TABLET (20 MG TOTAL) BY MOUTH DAILY.__INS ONLY ALLOW 30 DAYS__   LORATADINE 10 MG CAPS    Take 1 capsule (10 mg total) by mouth daily.  Modified Medications   No medications on file  Discontinued Medications   No medications on file    Subjective: Tammy Henson is in for her routine HIV follow-up visit.  She has had no problems obtaining, taking or tolerating her Descovy and Prezcobix.  She does not believe she has missed doses.  Review of Systems: Review of Systems  Constitutional: Negative for chills, diaphoresis, fever, malaise/fatigue and weight loss.  HENT: Negative for sore throat.   Respiratory: Negative for cough, sputum production and shortness of breath.   Cardiovascular:  Negative for chest pain.  Gastrointestinal: Negative for abdominal pain, diarrhea, heartburn, nausea and vomiting.  Genitourinary: Negative for dysuria and frequency.  Musculoskeletal: Negative for joint pain and myalgias.  Skin: Negative for rash.  Neurological: Negative for dizziness and headaches.  Psychiatric/Behavioral: Negative for depression and substance abuse. The patient is not nervous/anxious.     No past medical history on file.  Social History   Tobacco Use  . Smoking status: Never Smoker  . Smokeless tobacco: Never Used  Substance Use Topics  . Alcohol use: No    Alcohol/week: 0.0 oz  . Drug use: No    No family history on file.  No Known Allergies  Health Maintenance  Topic Date Due  . TETANUS/TDAP  07/24/1977  . COLONOSCOPY  07/24/2008  . PAP SMEAR  07/30/2016  . INFLUENZA VACCINE  09/28/2017  . MAMMOGRAM  01/13/2019  . Hepatitis C Screening  Completed  . HIV Screening  Completed    Objective:  Vitals:   06/12/17 1626  BP: 104/70  Pulse: 76  Temp: 98.3 F (36.8 C)  TempSrc: Oral  Weight: 141 lb (64 kg)  Height: 5' 2.5" (1.588 m)   Body mass index is 25.38 kg/m.  Physical Exam  Constitutional: She is oriented to person, place, and time.  HENT:  Mouth/Throat: No oropharyngeal exudate.  Eyes: Conjunctivae are normal.  Cardiovascular: Normal rate and regular rhythm.  No murmur heard. Pulmonary/Chest: Effort normal and breath sounds normal.  Abdominal:  Soft. She exhibits no mass. There is no tenderness.  Musculoskeletal: Normal range of motion.  Neurological: She is alert and oriented to person, place, and time.  Skin: No rash noted.    Lab Results Lab Results  Component Value Date   WBC 8.3 06/06/2016   HGB 13.4 06/06/2016   HCT 40.2 06/06/2016   MCV 94.4 06/06/2016   PLT 289 06/06/2016    Lab Results  Component Value Date   CREATININE 0.79 06/06/2016   BUN 16 06/06/2016   NA 139 06/06/2016   K 4.1 06/06/2016   CL 102  06/06/2016   CO2 26 06/06/2016    Lab Results  Component Value Date   ALT 10 06/06/2016   AST 11 06/06/2016   ALKPHOS 75 06/06/2016   BILITOT 0.3 06/06/2016    Lab Results  Component Value Date   CHOL 175 06/06/2016   HDL 59 06/06/2016   LDLCALC 49 06/06/2016   TRIG 335 (H) 06/06/2016   CHOLHDL 3.0 06/06/2016   Lab Results  Component Value Date   LABRPR NON REAC 06/06/2016   HIV 1 RNA Quant (copies/mL)  Date Value  05/29/2017 <20 NOT DETECTED  06/06/2016 <20 NOT DETECTED  04/07/2015 <20   CD4 T Cell Abs (/uL)  Date Value  05/29/2017 620  06/06/2016 540  04/07/2015 450     Problem List Items Addressed This Visit      High   Human immunodeficiency virus (HIV) disease (HCC)    Her infection remains under excellent, long-term control.  If her Tammy AuerbachCigna plan allows I will consolidate her antiretroviral therapy to Symtuza.  She will follow-up after lab work in 1 year.      Relevant Medications   Darunavir-Cobicisctat-Emtricitabine-Tenofovir Alafenamide (SYMTUZA) 800-150-200-10 MG TABS   Other Relevant Orders   CBC   T-helper cell (CD4)- ([facility] clinic only)   Comprehensive metabolic panel   Lipid panel   RPR   HIV 1 RNA quant-no reflex-bld        Cliffton AstersJohn Faiz Weber, MD [facility] [phone] pager   [phone] cell 08/17/2017, 8:21 AM

## 2017-07-03 ENCOUNTER — Institutional Professional Consult (permissible substitution): Payer: Self-pay | Admitting: Family Medicine

## 2017-08-16 ENCOUNTER — Telehealth: Payer: Self-pay

## 2017-08-16 NOTE — Telephone Encounter (Signed)
Pharmacy called today requesting refills on lisinopril, descovy, and prezcobix. Last note in pt chart stated pt might switch to Symtuza. Attempted to call pt to verify current regimen. However, pt was unable to answer phone call left a vm to call office back. Will route message to Dr. Orvan Falconerampbell to see which medication should be refilled for pt.   Refills are to be sent to [facility] on [address] Tammy Henson, CMA

## 2017-08-17 ENCOUNTER — Telehealth: Payer: Self-pay | Admitting: Pharmacist

## 2017-08-17 ENCOUNTER — Other Ambulatory Visit: Payer: Self-pay | Admitting: Pharmacist Clinician (PhC)/ Clinical Pharmacy Specialist

## 2017-08-17 ENCOUNTER — Other Ambulatory Visit: Payer: Self-pay | Admitting: *Deleted

## 2017-08-17 DIAGNOSIS — I10 Essential (primary) hypertension: Secondary | ICD-10-CM

## 2017-08-17 DIAGNOSIS — B20 Human immunodeficiency virus [HIV] disease: Secondary | ICD-10-CM

## 2017-08-17 MED ORDER — LISINOPRIL 20 MG PO TABS: ORAL_TABLET | ORAL | 5 refills | Status: DC

## 2017-08-17 MED ORDER — DARUN-COBIC-EMTRICIT-TENOFAF 800-150-200-10 MG PO TABS: 1.0000 | ORAL_TABLET | Freq: Every day | ORAL | 2 refills | Status: DC

## 2017-08-17 MED FILL — SYMTUZA 800-150-200-10 MG T: 800-150-200 | 30 days supply | Qty: 30 | Fill #0

## 2017-08-17 NOTE — Telephone Encounter (Signed)
Dr. Orvan Falconerampbell wanted me to verify if Tammy Henson is covered under patient's insurance.  Tammy Henson ran a test claim and it looked like her insurance was terminated on May 31st.   Called patient and she left her job and her insurance did indeed run out on May 31st.  She states she has been trying to call with no luck.  She took her last pill of Prezcobix and Descovy today.   Will activate a 30 day Symtuza trial card for her, and Tammy Henson will pick it up tomorrow morning before work [facility] [employer]. Patient will see Tammy Henson in the morning at 930am to apply for HMAP.  Once she is approved for HMAP, she will go back to Prezcobix and Descovy since Symtuza is not on HMAP formulary yet.  Patient is aware of plan and agrees.

## 2017-08-18 ENCOUNTER — Ambulatory Visit: Payer: Self-pay

## 2017-08-18 ENCOUNTER — Encounter: Payer: Self-pay | Admitting: Internal Medicine

## 2017-09-08 ENCOUNTER — Telehealth: Payer: Self-pay

## 2017-09-08 MED ORDER — EMTRICITABINE-TENOFOVIR AF 200-25 MG PO TABS: 1.0000 | ORAL_TABLET | Freq: Every day | ORAL | 2 refills | Status: DC

## 2017-09-08 MED ORDER — DARUNAVIR-COBICISTAT 800-150 MG PO TABS: 1.0000 | ORAL_TABLET | Freq: Every day | ORAL | 2 refills | Status: DC

## 2017-09-08 NOTE — Telephone Encounter (Signed)
Pharmacy calling to verify patients regimen. Her HMAP was approved.   Last note from pharmacist. Tammy Henson states patient will go back to original regimen of Descovy and Prezcobix.  I will submit this regimen to pharmacy .   Tammy JosephsAmmy K Elinore Shults, RN

## 2017-09-20 ENCOUNTER — Telehealth: Payer: Self-pay

## 2017-09-20 ENCOUNTER — Other Ambulatory Visit: Payer: Self-pay

## 2017-09-20 DIAGNOSIS — B20 Human immunodeficiency virus [HIV] disease: Secondary | ICD-10-CM

## 2017-09-20 NOTE — Telephone Encounter (Signed)
Called pt back regarding refills for Symtuza pt has active HMAP as of now. PT needs to schedule an app[facility] since she has not done so for the second half of the year. Will see if MD would like to change regimen to [facility] as it has been [facility] to [facility]. Will route message to Dr. Orvan Falconerampbell and Wilford Sportsassie, Pharmacist.  Tammy Henson, CMA

## 2017-09-25 ENCOUNTER — Telehealth: Payer: Self-pay | Admitting: Pharmacist

## 2017-09-25 ENCOUNTER — Other Ambulatory Visit: Payer: Self-pay | Admitting: Pharmacist

## 2017-09-25 DIAGNOSIS — B20 Human immunodeficiency virus [HIV] disease: Secondary | ICD-10-CM

## 2017-09-25 MED ORDER — EMTRICITABINE-TENOFOVIR AF 200-25 MG PO TABS: 1.0000 | ORAL_TABLET | Freq: Every day | ORAL | 11 refills | Status: DC

## 2017-09-25 MED ORDER — DARUNAVIR-COBICISTAT 800-150 MG PO TABS: 1.0000 | ORAL_TABLET | Freq: Every day | ORAL | 11 refills | Status: DC

## 2017-09-25 MED ORDER — DARUN-COBIC-EMTRICIT-TENOFAF 800-150-200-10 MG PO TABS: 1.0000 | ORAL_TABLET | Freq: Every day | ORAL | 2 refills | Status: DC

## 2017-09-25 NOTE — Telephone Encounter (Signed)
Symtuza should be fine now since it is on the [facility]. [facility]

## 2017-09-25 NOTE — Telephone Encounter (Signed)
Thanks for helping Tammy CornfieldStephanie and Tammy SagoSarah.

## 2017-09-25 NOTE — Telephone Encounter (Signed)
Patient actually walked into clinic and stated that the Symtuza was causing her some GI upset and wishes to go back to Prezcobix + Descovy.  I told her that was no problem - [facility].  She also brought in her daughter, Maralyn SagoSarah, to apply for HMAP.

## 2017-09-25 NOTE — Telephone Encounter (Signed)
Called Tammy Henson to let her know that I will [facility] [place] for her since she has HMAP and it is on the [facility] formulary now. I also told her that Maralyn SagoSarah (her daughter) has not brought in her paystubs to get HMAP yet and we will not refill her medications until she does so. She has not been seen in a year.

## 2017-09-25 NOTE — Addendum Note (Signed)
Addended by: Robinette HainesKUPPELWEISER, Boleslaus Holloway L on: 09/25/2017 12:24 PM   Modules accepted: Orders

## 2017-10-17 ENCOUNTER — Encounter: Payer: Self-pay | Admitting: Internal Medicine

## 2017-11-01 ENCOUNTER — Ambulatory Visit (INDEPENDENT_AMBULATORY_CARE_PROVIDER_SITE_OTHER): Payer: Self-pay

## 2017-11-01 DIAGNOSIS — Z23 Encounter for immunization: Secondary | ICD-10-CM

## 2017-11-01 NOTE — Progress Notes (Signed)
Patient received Flu vaccine today. Tolerated well. All questions answered.

## 2017-12-01 ENCOUNTER — Telehealth: Payer: Self-pay

## 2017-12-01 NOTE — Telephone Encounter (Signed)
Patient is on overdue pap list. Called to find out when last pap smear was done. Patient states she has not had a pap smear in awhile and would like to schedule one with our office if possible. Patient is scheduled to see Rexene Alberts, Np on 10/15. Lorenso Courier, [place]

## 2017-12-12 ENCOUNTER — Ambulatory Visit: Payer: Self-pay | Admitting: Infectious Diseases

## 2018-01-17 ENCOUNTER — Other Ambulatory Visit: Payer: Self-pay | Admitting: Internal Medicine

## 2018-01-17 DIAGNOSIS — I10 Essential (primary) hypertension: Secondary | ICD-10-CM

## 2018-04-17 ENCOUNTER — Other Ambulatory Visit: Payer: Self-pay

## 2018-04-20 ENCOUNTER — Other Ambulatory Visit: Payer: Self-pay

## 2018-04-20 DIAGNOSIS — B20 Human immunodeficiency virus [HIV] disease: Secondary | ICD-10-CM

## 2018-04-20 LAB — T-HELPER CELL (CD4) - (RCID CLINIC ONLY)
CD4 % Helper T Cell: 18 % — ABNORMAL LOW (ref 33–55)
CD4 T Cell Abs: 580 /uL (ref 400–2700)

## 2018-04-23 LAB — COMPREHENSIVE METABOLIC PANEL
AG Ratio: 1.3 (calc) (ref 1.0–2.5)
ALBUMIN MSPROF: 4.1 g/dL (ref 3.6–5.1)
ALKALINE PHOSPHATASE (APISO): 80 U/L (ref 37–153)
ALT: 12 U/L (ref 6–29)
AST: 11 U/L (ref 10–35)
BUN: 15 mg/dL (ref 7–25)
CHLORIDE: 102 mmol/L (ref 98–110)
CO2: 27 mmol/L (ref 20–32)
CREATININE: 0.94 mg/dL (ref 0.50–1.05)
Calcium: 9.9 mg/dL (ref 8.6–10.4)
GLOBULIN: 3.2 g/dL (ref 1.9–3.7)
GLUCOSE: 83 mg/dL (ref 65–99)
Potassium: 4.9 mmol/L (ref 3.5–5.3)
Sodium: 138 mmol/L (ref 135–146)
Total Bilirubin: 0.3 mg/dL (ref 0.2–1.2)
Total Protein: 7.3 g/dL (ref 6.1–8.1)

## 2018-04-23 LAB — LIPID PANEL
CHOL/HDL RATIO: 3.2 (calc) (ref <5.0)
Cholesterol: 140 mg/dL (ref <200)
HDL: 44 mg/dL — AB (ref >=50)
LDL Cholesterol (Calc): 63 mg/dL (calc)
NON-HDL CHOLESTEROL (CALC): 96 mg/dL (ref <130)
TRIGLYCERIDES: 318 mg/dL — AB (ref <150)

## 2018-04-23 LAB — CBC
HEMATOCRIT: 38.3 % (ref 35.0–45.0)
Hemoglobin: 13.1 g/dL (ref 11.7–15.5)
MCH: 31 pg (ref 27.0–33.0)
MCHC: 34.2 g/dL (ref 32.0–36.0)
MCV: 90.5 fL (ref 80.0–100.0)
MPV: 9.5 fL (ref 7.5–12.5)
Platelets: 414 10*3/uL — ABNORMAL HIGH (ref 140–400)
RBC: 4.23 10*6/uL (ref 3.80–5.10)
RDW: 12 % (ref 11.0–15.0)
WBC: 11.1 10*3/uL — AB (ref 3.8–10.8)

## 2018-04-23 LAB — HIV-1 RNA QUANT-NO REFLEX-BLD
HIV 1 RNA Quant: <20 copies/mL
HIV-1 RNA Quant, Log: <1.3 Log copies/mL

## 2018-04-23 LAB — RPR: RPR Ser Ql: NONREACTIVE

## 2018-05-01 ENCOUNTER — Ambulatory Visit: Payer: Self-pay | Admitting: Infectious Diseases

## 2018-05-01 ENCOUNTER — Encounter: Payer: Self-pay | Admitting: Internal Medicine

## 2018-05-07 ENCOUNTER — Ambulatory Visit (INDEPENDENT_AMBULATORY_CARE_PROVIDER_SITE_OTHER): Payer: Self-pay | Admitting: Infectious Diseases

## 2018-05-07 ENCOUNTER — Encounter: Payer: Self-pay | Admitting: Infectious Diseases

## 2018-05-07 DIAGNOSIS — Z124 Encounter for screening for malignant neoplasm of cervix: Secondary | ICD-10-CM

## 2018-05-07 NOTE — Progress Notes (Signed)
      Subjective:    Tammy Henson is a 58 y.o. female here for an annual pelvic exam and pap smear.   Review of Systems: Current GYN complaints or concerns: none. Allergy complaints - recently treated for sinus infection and had some improvement on her antibiotics but now with tension headaches again. Has not started her zyrtec yet.  Patient denies any abdominal/pelvic pain, problems with bowel movements, urination, vaginal discharge or intercourse.   Past Medical History:  Diagnosis Date  . Allergic rhinitis 12/31/2005   Qualifier: Diagnosis of  By: Lennox Laity RN, Angelique Blonder    . Human immunodeficiency virus (HIV) disease (HCC) 12/31/2005   Qualifier: Diagnosis of  By: Lennox Laity RN, Angelique Blonder    . HYPERLIPIDEMIA 12/31/2005   Qualifier: Diagnosis of  By: Lennox Laity RN, Angelique Blonder    . HYPERTENSION 12/31/2005   Qualifier: Diagnosis of  By: Lennox Laity RN, Angelique Blonder      Gynecologic History: 4 pregnancies, 4 live births via vaginal delivery  No LMP recorded. Patient is postmenopausal. Contraception: post menopausal status Last Pap: 07/2015. Results were: normal Anal Intercourse: no Last Mammogram: 12/2016. Results were: normal  Objective:  Physical Exam  Constitutional: Well developed, well nourished, no acute distress. She is alert and oriented x3. Well appearing today.  Pelvic: External genitalia is normal in appearance. The vagina is normal in appearance with exception of mild cystocele. The cervix is bulbous and easily visualized. No CMT, normal expected cervical mucus present atrophic vaginal tissue with some slight bleeding of cervix following brush. Bimanual exam reveals uterus that is felt to be normal size, shape, and contour. No adnexal masses or tenderness noted. Breasts: symmetrical in contour, shape and texture. No palpable masses/nodules. No nipple discharge.  Psych: She has a normal mood and affect.    Assessment:  Normal pelvic and bimanual exam.  Cystocele, mild, asymptomatic      Plan:  Health Maintenance =   Thin prep pap was obtained and sent for cytology with reflex HPV and GC/C today.   Normal clinical breast exam - mammogram due Nov 2020  Discussed recommended screening interval for women living with HIV disease. Will continue lifelong screenings and increase interval to q1yr per [facility] recommendations with 3 normal consecutive tests. She is not currently sexually active and lives with one female partner.   Results will be communicated to the patient via Phone Call  She has been counseled and instructed how to perform monthly self breast exams.  Screening mammogram to be scheduled.   Contraception / Family Planning =   Post-menopausal   Cystocele, mild, asymptomatic =   Incidental finding on pelvic exam.   Recommended to monitor for symptoms concerning for urinary leaking. Can refer to GYN/Urology if needed.   HIV =   She will continue her Prezcobix + Descovy and F/U as scheduled with Dr. Orvan Falconer for ongoing HIV care.    Rexene Alberts, MSN, NP-C [facility] Office: [phone] Pager: [phone]  05/07/18 2:33 PM

## 2018-05-07 NOTE — Patient Instructions (Addendum)
Your last mammogram was in November 2018 - results were normal. You will be due again in November 2020 to repeat this screening.   Will call you with results of today's pap smear. If all is normal again we can space these out to every 3 years if you are comfortable with that.   For your allergies -  1. Coricidin HBP is the over the counter decongestant to try for people who have high blood pressure.   2. Saline rinse or Neti pot to help irrigate your sinuses to help remove the allergens will be best for you.   3. Zyrtec I find is a bit stronger than claritin and I hope works better for you.

## 2018-05-09 LAB — CYTOLOGY - PAP
Chlamydia: NEGATIVE
Diagnosis: NEGATIVE
HPV: NOT DETECTED
Neisseria Gonorrhea: NEGATIVE

## 2018-05-10 NOTE — Progress Notes (Signed)
Please call Tammy Henson and let her know her pap smear was normal - no concern for any cervical cancer or pre cancer cells. 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 She needs to continue lifelong screenings but can schedule her next exam in 3 years (March 2023).

## 2018-05-15 ENCOUNTER — Telehealth: Payer: Self-pay

## 2018-05-15 NOTE — Telephone Encounter (Signed)
Patient returned called.  Informed patient per Rexene Alberts NP that her PAP smear was normal and there is no concern for cervical cancer or precancerous cells.  No HPV detected.  She needs to continue life long screenings and can schedule next exam in 3 years March 2023.  Patient verbalized understanding and was appreciative of the call.   Angeline Slim RN

## 2018-05-15 NOTE — Telephone Encounter (Signed)
-----   Message from Blanchard Kelch, NP sent at 05/10/2018 11:12 PM EDT ----- Please call Tammy Henson and let her know her pap smear was normal - no concern for any cervical cancer or pre cancer cells. 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 She needs to continue lifelong screenings but can schedule her next exam in 3 years (March 2023).

## 2018-05-15 NOTE — Telephone Encounter (Signed)
Patient called. Unable to leave VM at this time. LPN will call back at a later time to make aware of PAP results.  Valarie Cones, LPN

## 2018-05-23 ENCOUNTER — Telehealth: Payer: Self-pay

## 2018-05-23 NOTE — Telephone Encounter (Signed)
Called patient and made her aware of PAP results.  Valarie Cones, LPN

## 2018-05-23 NOTE — Telephone Encounter (Signed)
-----   Message from Tiffany N Dixon, NP sent at 05/10/2018 11:12 PM EDT ----- Please call Tammy Henson and let her know her pap smear was normal - no concern for any cervical cancer or pre cancer cells. 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 She needs to continue lifelong screenings but can schedule her next exam in 3 years (March 2023). 

## 2018-06-05 ENCOUNTER — Ambulatory Visit: Payer: Self-pay | Admitting: Internal Medicine

## 2018-06-05 ENCOUNTER — Telehealth: Payer: Self-pay | Admitting: *Deleted

## 2018-06-05 NOTE — Telephone Encounter (Signed)
RN contacted patient to offer evisit for today's visit. She has started a new job, will need to reschedule.  She is available before 9 am, takes lunch from 12:30-1:30.  She would like to reschedule today's visit for a later date, hopefully early morning. RN sent a mychart code to her mobile phone so she can access Dr Orvan Falconer and her lab results.  Per Dr Orvan Falconer, ok to relay lab results, plan of care once she sets up MyChart.  Will reschedule today's visit for 6 months with labs.  RN will make sure patient's RW/ADAP are still active. Andree Coss, RN

## 2018-06-06 ENCOUNTER — Encounter: Payer: Self-pay | Admitting: *Deleted

## 2018-06-07 ENCOUNTER — Other Ambulatory Visit: Payer: Self-pay

## 2018-06-07 ENCOUNTER — Encounter: Payer: Self-pay | Admitting: Internal Medicine

## 2018-06-07 ENCOUNTER — Ambulatory Visit: Payer: Self-pay

## 2018-06-07 NOTE — Progress Notes (Signed)
This encounter was created in error - please disregard.

## 2018-07-16 ENCOUNTER — Other Ambulatory Visit: Payer: Self-pay | Admitting: Internal Medicine

## 2018-07-16 DIAGNOSIS — I10 Essential (primary) hypertension: Secondary | ICD-10-CM

## 2018-09-11 ENCOUNTER — Telehealth: Payer: Self-pay | Admitting: Pharmacy Technician

## 2018-09-11 NOTE — Telephone Encounter (Signed)
I learned today that Tammy Henson is in need of patient assistance for two HIV medications.  She is working with Robert Bellow for [place] states that approval is imminent (she called them to confirm) and that the state misplaced the application so that it has been a longer than usual process.  I called Ms. Stapleton and explained that one of the manufacturers typically approve slowly, up to two weeks, therefore HMAP approval will come first.  She understands and will wait on Sharyn Lull to let her know when she is approved for HMAP.  Venida Jarvis. Nadara Mustard [place] Patient [facility] Phone: [phone] Fax:  [phone]

## 2018-09-11 NOTE — Telephone Encounter (Signed)
error 

## 2018-09-20 ENCOUNTER — Encounter: Payer: Self-pay | Admitting: Internal Medicine

## 2018-10-20 ENCOUNTER — Other Ambulatory Visit: Payer: Self-pay | Admitting: Internal Medicine

## 2018-10-20 DIAGNOSIS — B20 Human immunodeficiency virus [HIV] disease: Secondary | ICD-10-CM

## 2018-10-22 ENCOUNTER — Telehealth: Payer: Self-pay

## 2018-10-22 DIAGNOSIS — B20 Human immunodeficiency virus [HIV] disease: Secondary | ICD-10-CM

## 2018-10-22 NOTE — Telephone Encounter (Signed)
Called patient to schedule overdue appointment with Dr. Megan Salon and lab work two weeks before. Left voicemail requesting patient call office back to schedule appointment.  Tammy Henson

## 2018-11-22 MED ORDER — PREZCOBIX 800-150 MG PO TABS: 1.0000 | ORAL_TABLET | Freq: Every day | ORAL | 0 refills | Status: DC

## 2018-11-22 MED ORDER — DESCOVY 200-25 MG PO TABS: 1.0000 | ORAL_TABLET | Freq: Every day | ORAL | 0 refills | Status: DC

## 2018-11-22 NOTE — Addendum Note (Signed)
Addended by: Eugenia Mcalpine on: 11/22/2018 02:20 PM   Modules accepted: Orders

## 2018-11-22 NOTE — Telephone Encounter (Signed)
Patient returned call to schedule overdue appointments. Medication refills sent to [facility] for Prezcobix and [place]

## 2018-11-26 ENCOUNTER — Other Ambulatory Visit: Payer: Self-pay

## 2018-11-26 DIAGNOSIS — B20 Human immunodeficiency virus [HIV] disease: Secondary | ICD-10-CM

## 2018-11-27 LAB — T-HELPER CELL (CD4) - (RCID CLINIC ONLY)
CD4 % Helper T Cell: 11 % — ABNORMAL LOW (ref 33–65)
CD4 T Cell Abs: 234 /uL — ABNORMAL LOW (ref 400–1790)

## 2018-11-28 LAB — CBC WITH DIFFERENTIAL/PLATELET
Absolute Monocytes: 645 cells/uL (ref 200–950)
Basophils Absolute: 37 cells/uL (ref 0–200)
Basophils Relative: 0.6 %
Eosinophils Absolute: 37 cells/uL (ref 15–500)
Eosinophils Relative: 0.6 %
HCT: 39.2 % (ref 35.0–45.0)
Hemoglobin: 13.2 g/dL (ref 11.7–15.5)
Lymphs Abs: 2158 cells/uL (ref 850–3900)
MCH: 30.6 pg (ref 27.0–33.0)
MCHC: 33.7 g/dL (ref 32.0–36.0)
MCV: 91 fL (ref 80.0–100.0)
MPV: 10 fL (ref 7.5–12.5)
Monocytes Relative: 10.4 %
Neutro Abs: 3323 cells/uL (ref 1500–7800)
Neutrophils Relative %: 53.6 %
Platelets: 254 10*3/uL (ref 140–400)
RBC: 4.31 10*6/uL (ref 3.80–5.10)
RDW: 12.9 % (ref 11.0–15.0)
Total Lymphocyte: 34.8 %
WBC: 6.2 10*3/uL (ref 3.8–10.8)

## 2018-11-28 LAB — COMPREHENSIVE METABOLIC PANEL
AG Ratio: 1.5 (calc) (ref 1.0–2.5)
ALT: 17 U/L (ref 6–29)
AST: 17 U/L (ref 10–35)
Albumin: 4.1 g/dL (ref 3.6–5.1)
Alkaline phosphatase (APISO): 72 U/L (ref 37–153)
BUN: 16 mg/dL (ref 7–25)
CO2: 26 mmol/L (ref 20–32)
Calcium: 8.8 mg/dL (ref 8.6–10.4)
Chloride: 103 mmol/L (ref 98–110)
Creat: 0.85 mg/dL (ref 0.50–0.99)
Globulin: 2.7 g/dL (calc) (ref 1.9–3.7)
Glucose, Bld: 92 mg/dL (ref 65–99)
Potassium: 4.4 mmol/L (ref 3.5–5.3)
Sodium: 137 mmol/L (ref 135–146)
Total Bilirubin: 0.5 mg/dL (ref 0.2–1.2)
Total Protein: 6.8 g/dL (ref 6.1–8.1)

## 2018-11-28 LAB — HIV-1 RNA QUANT-NO REFLEX-BLD
HIV 1 RNA Quant: 36 copies/mL — ABNORMAL HIGH
HIV-1 RNA Quant, Log: 1.56 Log copies/mL — ABNORMAL HIGH

## 2018-12-11 ENCOUNTER — Encounter: Payer: Self-pay | Admitting: Internal Medicine

## 2018-12-11 ENCOUNTER — Other Ambulatory Visit: Payer: Self-pay

## 2018-12-11 ENCOUNTER — Ambulatory Visit (INDEPENDENT_AMBULATORY_CARE_PROVIDER_SITE_OTHER): Payer: Self-pay | Admitting: Internal Medicine

## 2018-12-11 DIAGNOSIS — Z23 Encounter for immunization: Secondary | ICD-10-CM

## 2018-12-11 DIAGNOSIS — B20 Human immunodeficiency virus [HIV] disease: Secondary | ICD-10-CM

## 2018-12-11 MED ORDER — DESCOVY 200-25 MG PO TABS: 1.0000 | ORAL_TABLET | Freq: Every day | ORAL | 11 refills | Status: DC

## 2018-12-11 MED ORDER — PREZCOBIX 800-150 MG PO TABS: 1.0000 | ORAL_TABLET | Freq: Every day | ORAL | 11 refills | Status: DC

## 2018-12-11 NOTE — Progress Notes (Signed)
Patient Active Problem List   Diagnosis Date Noted  . Human immunodeficiency virus (HIV) disease (HCC) 12/31/2005    Priority: High  . Depression 09/28/2015  . Anxiety 01/29/2013  . CARPAL TUNNEL SYNDROME 09/10/2009  . 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 DIARRHEA, CHRONIC 07/31/2008  . TINEA VERSICOLOR 12/31/2005  . HYPERLIPIDEMIA 12/31/2005  . ANEMIA-NOS 12/31/2005  . HYPERTENSION 12/31/2005  . Allergic rhinitis 12/31/2005    Patient's Medications  New Prescriptions   No medications on file  Previous Medications   CETIRIZINE (ZYRTEC) 10 MG TABLET    Take 10 mg by mouth daily.   DARUNAVIR-COBICISTAT (PREZCOBIX) 800-150 MG TABLET    Take 1 tablet by mouth daily with breakfast. Swallow whole. Do NOT crush, break or chew tablets. Take with food.   EMTRICITABINE-TENOFOVIR AF (DESCOVY) 200-25 MG TABLET    Take 1 tablet by mouth daily.   FISH OIL-OMEGA-3 FATTY ACIDS 1000 MG CAPSULE    Take 2 capsules (2 g total) by mouth 2 (two) times daily.   FLUNISOLIDE (NASALIDE) 25 MCG/ACT (0.025%) SOLN    Place 2 sprays into the nose 2 (two) times daily.   LISINOPRIL (ZESTRIL) 20 MG TABLET    TAKE 1 TABLET (20 MG TOTAL) BY MOUTH DAILY (INS ONLY ALLOWS 30 DAYS)   LORATADINE 10 MG CAPS    Take 1 capsule (10 mg total) by mouth daily.  Modified Medications   No medications on file  Discontinued Medications   No medications on file    Subjective: Tammy Henson is in for her routine HIV follow-up visit.  She did not know that she needed to renew her ADAP and was out of medication for about 1 month in July.  Other than that she denies missing any doses.  She is working part-time during the pandemic.  She is feeling well.  Review of Systems: Review of Systems  Constitutional: Negative for fever.  Respiratory: Negative for cough and shortness of breath.   Cardiovascular: Negative for chest pain.  Gastrointestinal: Negative for abdominal pain, diarrhea, nausea and vomiting.  Psychiatric/Behavioral:  Negative for depression.    Past Medical History:  Diagnosis Date  . Allergic rhinitis 12/31/2005   Qualifier: Diagnosis of  By: Lennox Laity RN, Angelique Blonder    . Human immunodeficiency virus (HIV) disease (HCC) 12/31/2005   Qualifier: Diagnosis of  By: Lennox Laity RN, Angelique Blonder    . HYPERLIPIDEMIA 12/31/2005   Qualifier: Diagnosis of  By: Lennox Laity RN, Angelique Blonder    . HYPERTENSION 12/31/2005   Qualifier: Diagnosis of  By: Lennox Laity RN, Angelique Blonder      Social History   Tobacco Use  . Smoking status: Never Smoker  . Smokeless tobacco: Never Used  Substance Use Topics  . Alcohol use: No    Alcohol/week: 0.0 standard drinks  . Drug use: No    No family history on file.  No Known Allergies  Health Maintenance  Topic Date Due  . TETANUS/TDAP  07/24/1977  . COLONOSCOPY  07/24/2008  . INFLUENZA VACCINE  09/29/2018  . MAMMOGRAM  01/13/2019  . PAP SMEAR-Modifier  05/07/2019  . Hepatitis C Screening  Completed  . HIV Screening  Completed    Objective:  Vitals:   12/11/18 0850  BP: 127/82  Pulse: 85  Temp: 98.2 F (36.8 C)  SpO2: 100%   There is no height or weight on file to calculate BMI.  Physical Exam Constitutional:      Comments: She is in good spirits.  Cardiovascular:  Rate and Rhythm: Normal rate and regular rhythm.     Heart sounds: No murmur.  Pulmonary:     Effort: Pulmonary effort is normal.     Breath sounds: Normal breath sounds.  Abdominal:     Palpations: Abdomen is soft.     Tenderness: There is no abdominal tenderness.  Psychiatric:        Mood and Affect: Mood normal.     Lab Results Lab Results  Component Value Date   WBC 6.2 11/26/2018   HGB 13.2 11/26/2018   HCT 39.2 11/26/2018   MCV 91.0 11/26/2018   PLT 254 11/26/2018    Lab Results  Component Value Date   CREATININE 0.85 11/26/2018   BUN 16 11/26/2018   NA 137 11/26/2018   K 4.4 11/26/2018   CL 103 11/26/2018   CO2 26 11/26/2018    Lab Results  Component Value Date   ALT 17 11/26/2018    AST 17 11/26/2018   ALKPHOS 75 06/06/2016   BILITOT 0.5 11/26/2018    Lab Results  Component Value Date   CHOL 140 04/20/2018   HDL 44 (L) 04/20/2018   LDLCALC 63 04/20/2018   TRIG 318 (H) 04/20/2018   CHOLHDL 3.2 04/20/2018   Lab Results  Component Value Date   LABRPR NON-REACTIVE 04/20/2018   HIV 1 RNA Quant (copies/mL)  Date Value  11/26/2018 36 (H)  04/20/2018 <20 NOT DETECTED  05/29/2017 <20 NOT DETECTED   CD4 T Cell Abs (/uL)  Date Value  11/26/2018 234 (L)  04/20/2018 580  05/29/2017 620     Problem List Items Addressed This Visit      High   Human immunodeficiency virus (HIV) disease ([place])    She has low level viral activation and a slight dip in her CD4 count, possibly due to being off of her medication for 1 month recently.  She received her influenza and Prevnar vaccines today.  She will continue her current antiviral regimen (she had problems tolerating his Symtuza) and follow-up after lab work in 1 year.      Relevant Orders   CBC   T-helper cell (CD4)- ([facility] clinic only)   Comprehensive metabolic panel   Lipid panel   RPR   HIV-1 RNA quant-no reflex-bld        Michel Bickers, MD [facility] for [place] [address] [phone] pager   [phone] cell 12/11/2018, 9:03 AM

## 2018-12-11 NOTE — Assessment & Plan Note (Signed)
She has low level viral activation and a slight dip in her CD4 count, possibly due to being off of her medication for 1 month recently.  She received her influenza and Prevnar vaccines today.  She will continue her current antiviral regimen (she had problems tolerating his Symtuza) and follow-up after lab work in 1 year.

## 2018-12-21 ENCOUNTER — Other Ambulatory Visit: Payer: Self-pay | Admitting: Internal Medicine

## 2018-12-21 DIAGNOSIS — B20 Human immunodeficiency virus [HIV] disease: Secondary | ICD-10-CM

## 2019-01-15 ENCOUNTER — Other Ambulatory Visit: Payer: Self-pay | Admitting: Internal Medicine

## 2019-01-15 DIAGNOSIS — I10 Essential (primary) hypertension: Secondary | ICD-10-CM

## 2019-03-20 ENCOUNTER — Other Ambulatory Visit: Payer: Self-pay

## 2019-03-20 ENCOUNTER — Ambulatory Visit: Payer: 59

## 2019-06-16 ENCOUNTER — Encounter: Payer: Self-pay | Admitting: Family Medicine

## 2019-06-16 NOTE — Progress Notes (Signed)
Chief Complaint  Patient presents with  . Annual Exam    nonfasting(had some coffee with creamer). I did pull out her records from UC visit last week in [place]. Still having some left sided pain. She is also fatigued and stressed-wonders if her vitamin D level is low.    Tammy Henson is a 59 y.o. female who presents for a complete physical.   She hasn't been seen in this office since 2012. She stopped coming to PCP once Dr. Blair Dolphin office started doing her pap smears. She just recently ended a 17 year relationship (with an alcoholic), had been a little down at first, but feels like she is doing much better now.  She has HIV and is seen regularly by Dr. Orvan Falconer, last in 11/2018. She gets labs done yearly through their office, last done 10/2018.  At that time counts were a little off (possibly due to being out of a medication for a month). She went 2 years without insurance (after she retired from [employer]). TG have been elevated through ID clinic, but is never fasting for labs. She is not fasting today,   Hypertension:  Compliant with lisinopril.  Denies side effects or cough.  Doesn't monitor BP elsewhere.  No headaches (except sinus), chest pain, palpitations, edema. (review of paper chart shows that she had cough in 2012, changed to losartan)  She routinely seeks care through urgent cares ([facility] more recently, and prior to that through [facility]).  She was seen 4/15 in UC with complaint of acute onset of L flank pain.  Urine was normal. She was given a muscle relaxant, but admits she never took it (had a bad experience in the past).  Pain has improved, still has some slight discomfort with certain positions and with deep breaths. Heat helps, as well as Hemp cream. She has alternated ibuprofen (600mg ) and tylenol, taking ibuprofen up to 2 times daily, hasn't taken any since Saturday. These measures help.  She was seen 3/14 in UC with lower abdominal complaints.  She was felt  to have UTI, and treated with cefdinir x 5 days.  Urine culture was negative. She currently denies any urinary complaints.  She was treated with Augmentin for sinus infection in 12/2018. She is having some sinus congestion, but reports that mucus is clear.  Immunization History  Administered Date(s) Administered  . Hepatitis B 05/18/2004, 07/20/2004, 02/08/2005  . Influenza Split 03/01/2011, 01/25/2012  . Influenza Whole 02/08/2005, 02/14/2006, 12/30/2006, 12/05/2007  . Influenza,inj,Quad PF,6+ Mos 01/29/2013, 10/23/2013, 12/23/2014, 10/17/2016, 11/01/2017, 12/11/2018  . Pneumococcal Polysaccharide-23 05/18/2004, 09/10/2009   Was to get Prevnar-13 (recommende by Dr. 09/12/2009 in 11/2018), but she reports she didn't go back to get it. Last tetanus >10 years ago (documented as under 10 in 2012) Last Pap smear: 04/2018 (and told next due 2023) Last mammogram: 12/2016 Last colonoscopy: 10/2008, Dr. 11/2008, past due. Last DEXA: never Dentist: past due, plans to schedule. Ophtho: went recently and got new glasses Exercise: None recently.  Lipid screen: Lab Results  Component Value Date   CHOL 140 04/20/2018   HDL 44 (L) 04/20/2018   LDLCALC 63 04/20/2018   TRIG 318 (H) 04/20/2018   CHOLHDL 3.2 04/20/2018   PMH, PSH, SH and FH were reviewed and updated.  Outpatient Encounter Medications as of 06/17/2019  Medication Sig Note  . Cholecalciferol (VITAMIN D) 50 MCG (2000 UT) tablet Take 2,000 Units by mouth daily.   . DESCOVY 200-25 MG tablet TAKE 1 TABLET BY MOUTH DAILY   .  lisinopril (ZESTRIL) 20 MG tablet TAKE 1 TABLET (20 MG TOTAL) BY MOUTH DAILY   . Loratadine 10 MG CAPS Take 1 capsule (10 mg total) by mouth daily. 06/12/2017: prn  . PREZCOBIX 800-150 MG tablet TAKE 1 TABLET BY MOUTH DAILY WITH BREAKFAST. SWALLOW WHOLE. DO NOT CRUSH, BREAK OR CHEW TABLETS. TAKE WITH FOOD   . sodium chloride (OCEAN) 0.65 % SOLN nasal spray Place 1 spray into both nostrils as needed for congestion.   .  cetirizine (ZYRTEC) 10 MG tablet Take 10 mg by mouth daily. 06/17/2019: Changed to Claritin yesterday  . Multiple Vitamins-Minerals (MULTIVITAMIN WITH MINERALS) tablet Take 1 tablet by mouth daily. 06/17/2019: Herbal Life  . Specialty Vitamins Products (MENOPAUSE SUPPORT PO) Take 1 tablet by mouth daily. 06/17/2019: Herbal Life (ran out, plans to order more)  . [DISCONTINUED] fish oil-omega-3 fatty acids 1000 MG capsule Take 2 capsules (2 g total) by mouth 2 (two) times daily. (Patient not taking: Reported on 09/28/2015)   . [DISCONTINUED] flunisolide (NASALIDE) 25 MCG/ACT (0.025%) SOLN Place 2 sprays into the nose 2 (two) times daily. (Patient not taking: Reported on 12/23/2014)    No facility-administered encounter medications on file as of 06/17/2019.   No Known Allergies  ROS:  The patient denies anorexia, fever, weight changes, headaches,  vision changes, decreased hearing, ear pain, sore throat, breast concerns, chest pain, palpitations, dizziness, syncope, dyspnea on exertion, cough, swelling, nausea, vomiting, diarrhea (just occasional, related to stress, diet), constipation, abdominal pain, melena, hematochezia, indigestion/heartburn, hematuria, incontinence, dysuria, vaginal bleeding, discharge, odor or itch, genital lesions, joint pains, numbness, tingling, weakness, tremor, suspicious skin lesions,abnormal bleeding/bruising, or enlarged lymph nodes. Slight sinus pressure intermittently, related to the pollen. Mucus is clear. Switched from zyrtec to claritin yesterday. Flank pain is improving. Denies depression or anxiety.  Moods are improving with it being lighter outside, and getting adjusted to being alone.  "It has gotten a lot better". Every now and then has some trouble sleeping.  Sunday nights are the hardest, due to going to work on Monday. Sometimes uses "natural calm" which helps (magnesium). Has some dental problems--needs extractions and probably dentures.   PHYSICAL EXAM:  BP  130/80   Pulse 72   Temp 97.7 F (36.5 C) (Other (Comment))   Ht 5' 3.5" (1.613 m)   Wt 144 lb (65.3 kg)   BMI 25.11 kg/m   Wt Readings from Last 3 Encounters:  06/17/19 144 lb (65.3 kg)  06/12/17 141 lb (64 kg)  06/20/16 136 lb (61.7 kg)    General Appearance:    Alert, cooperative, no distress, appears stated age  Head:    Normocephalic, without obvious abnormality, atraumatic  Eyes:    PERRL, conjunctiva/corneas clear, EOM's intact, fundi    benign  Ears:    Normal TM's and external ear canals  Nose:   Not examined, wearing mask due to COVID19 pandemic  Throat:   Not examined, wearing mask due to COVID19 pandemic  Neck:   Supple, no lymphadenopathy;  thyroid:  no   enlargement/tenderness/nodules; no carotid   bruit or JVD  Back:    Spine nontender, no curvature, ROM normal, no CVA     Tenderness. 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  There is some mild erythema over this area, which blanches easily (from sun?). This is nontender (and she reports mild gradual growth over years)  Lungs:     Clear to auscultation bilaterally without wheezes, rales or     ronchi; respirations unlabored  Chest Wall:    No tenderness or deformity. Nontender along ribs   Heart:    Regular rate and rhythm, S1 and S2 normal, no murmur, rub   or gallop  Breast Exam:    No tenderness, masses, or nipple discharge or inversion.      No axillary lymphadenopathy  Abdomen:     Soft, non-tender, nondistended, normoactive bowel sounds,    no masses, no hepatosplenomegaly  Genitalia:    Normal external genitalia without lesions.  BUS and vagina normal; no cervical motion tenderness. No abnormal vaginal discharge.  Uterus and adnexa not enlarged, nontender, no masses.  Pap not performed  Rectal:    Normal tone, no masses or tenderness; guaiac negative stool. There are two areas  that are enlarged, the posterior portion is significantly larger  It is soft, nontender.  Doesn't appear vascular currently (though she feels like they are hemorrhoids that get more inflamed when she has diarrhea, asymptomatic now).  Extremities:   No clubbing, cyanosis or edema  Pulses:   2+ and symmetric all extremities  Skin:   Skin color, texture, turgor normal, no rashes or lesions. Erythema at her upper back (?related to sun)  Lymph nodes:   Cervical, supraclavicular, and axillary nodes normal  Neurologic:   CNII-XII intact, normal strength, sensation and gait; reflexes 2+ and symmetric throughout          Psych:   Normal mood, affect, hygiene and grooming.    PHQ-9 score of 1 (related to fatigue).  ASSESSMENT/PLAN:  Annual physical exam - Plan: Lipid panel, VITAMIN D 25 Hydroxy (Vit-D Deficiency, Fractures), TSH  Immunization due - Risks/SE of TdaP and Prevnar-13 reviewed with pt; COVID vaccine rec, followed by Shingrix 4 weeks later - Plan: Tdap vaccine greater than or equal to 7yo IM, Pneumococcal conjugate vaccine 13-valent  Colon cancer screening - prefers Cologuard over colonoscopy; understands need for colonoscopy if +Cologuard - Plan: Cologuard  Vitamin D deficiency - compliant with supplement.  Complaining of fatigue and requesting level to be checked - Plan: VITAMIN D 25 Hydroxy (Vit-D Deficiency, Fractures)  Hypertriglyceridemia - always noted when not fasting.  To return for fasting labs. Briefly reviewed proper diet - Plan: Lipid panel  Fatigue, unspecified type - She relates this to her job, being tired at end of day. Sleeps well, feels refreshed in mornings. Exercise encouraged - Plan: VITAMIN D 25 Hydroxy (Vit-D Deficiency, Fractures), TSH  Essential hypertension - controlled  Human immunodeficiency virus (HIV) disease (HCC) - treated/monitored by Dr. Orvan Falconer.   Left-sided low back pain without sciatica, unspecified chronicity - resolving, musculoskeletal. Discussed  ibuprofen dosing, heat. f/u if continues to have pain w/breathing, may need CXR.    Fasting lipids, TSH, D (to return when fasting) CBC and chem done by ID clinic, scheduled for October, and normal on last check in 10/2018.  Discussed monthly self breast exams and yearly mammograms (past due); at least 30 minutes of aerobic activity at least 5 days/week, weight-bearing exercise at least 2x/week; proper sunscreen use reviewed; healthy diet, including goals of calcium and vitamin D intake and alcohol recommendations (less than or equal to 1 drink/day) reviewed; regular seatbelt use; changing batteries in smoke detectors, carbon monoxide detectors recommended.  Immunization recommendations discussed--Tdap and Prevnar given today. COVID vaccine recommended, counseled on risks/SE and where to get. Shingrix recommended--to wait 4 weeks after last COVID vaccine,  and schedule NV after checking with her insurance.  Colonoscopy recommendations reviewed, past due. Elects to have cologuard rather than colonoscopy.

## 2019-06-17 ENCOUNTER — Encounter: Payer: Self-pay | Admitting: Family Medicine

## 2019-06-17 ENCOUNTER — Other Ambulatory Visit: Payer: Self-pay

## 2019-06-17 ENCOUNTER — Ambulatory Visit: Payer: 59 | Admitting: Family Medicine

## 2019-06-17 ENCOUNTER — Encounter: Payer: Self-pay | Admitting: *Deleted

## 2019-06-17 VITALS — BP 130/80 | HR 72 | Temp 97.7°F | Ht 63.5 in | Wt 144.0 lb

## 2019-06-17 DIAGNOSIS — M545 Low back pain, unspecified: Secondary | ICD-10-CM

## 2019-06-17 DIAGNOSIS — E559 Vitamin D deficiency, unspecified: Secondary | ICD-10-CM

## 2019-06-17 DIAGNOSIS — Z1211 Encounter for screening for malignant neoplasm of colon: Secondary | ICD-10-CM

## 2019-06-17 DIAGNOSIS — E781 Pure hyperglyceridemia: Secondary | ICD-10-CM

## 2019-06-17 DIAGNOSIS — Z0000 Encounter for general adult medical examination without abnormal findings: Secondary | ICD-10-CM | POA: Diagnosis not present

## 2019-06-17 DIAGNOSIS — B20 Human immunodeficiency virus [HIV] disease: Secondary | ICD-10-CM

## 2019-06-17 DIAGNOSIS — Z23 Encounter for immunization: Secondary | ICD-10-CM

## 2019-06-17 DIAGNOSIS — R5383 Other fatigue: Secondary | ICD-10-CM

## 2019-06-17 DIAGNOSIS — I10 Essential (primary) hypertension: Secondary | ICD-10-CM

## 2019-06-17 NOTE — Patient Instructions (Addendum)
  HEALTH MAINTENANCE RECOMMENDATIONS:  It is recommended that you get at least 30 minutes of aerobic exercise at least 5 days/week (for weight loss, you may need as much as 60-90 minutes). This can be any activity that gets your heart rate up. This can be divided in 10-15 minute intervals if needed, but try and build up your endurance at least once a week.  Weight bearing exercise is also recommended twice weekly.  Eat a healthy diet with lots of vegetables, fruits and fiber.  "Colorful" foods have a lot of vitamins (ie green vegetables, tomatoes, red peppers, etc).  Limit sweet tea, regular sodas and alcoholic beverages, all of which has a lot of calories and sugar.  Up to 1 alcoholic drink daily may be beneficial for women (unless trying to lose weight, watch sugars).  Drink a lot of water.  Calcium recommendations are 1200-1500 mg daily (1500 mg for postmenopausal women or women without ovaries), and vitamin D 1000 IU daily.  This should be obtained from diet and/or supplements (vitamins), and calcium should not be taken all at once, but in divided doses.  Monthly self breast exams and yearly mammograms for women over the age of 38 is recommended.  Please call the [facility] to schedule your yearly mammogram.  Sunscreen of at least SPF 30 should be used on all sun-exposed parts of the skin when outside between the hours of 10 am and 4 pm (not just when at beach or pool, but even with exercise, golf, tennis, and yard work!)  Use a sunscreen that says "broad spectrum" so it covers both UVA and UVB rays, and make sure to reapply every 1-2 hours.  Remember to change the batteries in your smoke detectors when changing your clock times in the spring and fall.  Carbon monoxide detectors are also recommended for your home.  Use your seat belt every time you are in a car, and please drive safely and not be distracted with cell phones and texting while driving.   Schedule your mammogram. We gave you  TdaP and Prevnar (pneumonia vaccine that was recommended by Dr. Orvan Falconer in October) today. I recommend for you to get the Pfizer or Moderna COVID vaccines.  You need to wait 2 weeks from today's vaccines before getting those. You can schedule through pharmacies near you, or the health department. COVID-19 Vaccine Information can be found at: [email or web address] For questions related to vaccine distribution or appointments, please email [email or web address] or call [phone].   I recommend getting the new shingles vaccine (Shingrix). You will need to check with your insurance to see if it is covered, and if covered, schedule a nurse visit at our office.  It is a series of 2 injections, spaced 2 months apart. You should wait 4 weeks after the last COVID vaccine before starting the Shingrix series.  You can use 600-800mg  if ibuprofen up to three times daily with food (cut back the dose or frequency if it upsets your stomach). Continue the heating pad.  We are referring you for Cologuard testing for colon cancer screening.  If it comes back abnormal, you will need to schedule a colonoscopy.  Return for fasting labs.

## 2019-06-20 ENCOUNTER — Other Ambulatory Visit: Payer: Self-pay

## 2019-06-20 ENCOUNTER — Other Ambulatory Visit: Payer: 59

## 2019-06-20 DIAGNOSIS — E559 Vitamin D deficiency, unspecified: Secondary | ICD-10-CM

## 2019-06-20 DIAGNOSIS — Z0000 Encounter for general adult medical examination without abnormal findings: Secondary | ICD-10-CM

## 2019-06-20 DIAGNOSIS — R5383 Other fatigue: Secondary | ICD-10-CM

## 2019-06-20 DIAGNOSIS — E781 Pure hyperglyceridemia: Secondary | ICD-10-CM

## 2019-06-21 LAB — LIPID PANEL
Chol/HDL Ratio: 3 ratio (ref 0.0–4.4)
Cholesterol, Total: 140 mg/dL (ref 100–199)
HDL: 46 mg/dL (ref >39)
LDL Chol Calc (NIH): 59 mg/dL (ref 0–99)
Triglycerides: 216 mg/dL — ABNORMAL HIGH (ref 0–149)
VLDL Cholesterol Cal: 35 mg/dL (ref 5–40)

## 2019-06-21 LAB — VITAMIN D 25 HYDROXY (VIT D DEFICIENCY, FRACTURES): Vit D, 25-Hydroxy: 32.7 ng/mL (ref 30.0–100.0)

## 2019-06-21 LAB — TSH: TSH: 1.18 u[IU]/mL (ref 0.450–4.500)

## 2019-07-01 ENCOUNTER — Telehealth: Payer: Self-pay

## 2019-07-01 NOTE — Telephone Encounter (Signed)
Received PA for patient's Descovy. PA was initiated  and approved for one year.  Will fax to pharmacy Lorenso Courier, CMA

## 2019-07-13 ENCOUNTER — Other Ambulatory Visit: Payer: Self-pay | Admitting: Internal Medicine

## 2019-07-13 DIAGNOSIS — I10 Essential (primary) hypertension: Secondary | ICD-10-CM

## 2019-10-16 ENCOUNTER — Ambulatory Visit: Payer: 59

## 2019-10-18 ENCOUNTER — Other Ambulatory Visit: Payer: Self-pay

## 2019-10-18 ENCOUNTER — Ambulatory Visit: Payer: Self-pay

## 2019-10-30 ENCOUNTER — Telehealth: Payer: Self-pay | Admitting: Pharmacy Technician

## 2019-10-30 NOTE — Telephone Encounter (Addendum)
[facility] Patient Advocate Encounter  Completed and sent [employer] Advancing Access application for Descovy for this patient who is uninsured.    Patient is approved through 10/29/2020.     Submitted application to [employer] for Prezcobix assistance.  Status is APPROVED through 11/05/2020  ID:  [id number] B:  [id number] G:  [id number] P:  [id number]    Keiasha Diep E. Dimas Aguas CPhT [facility] Patient [facility] Phone: [phone] Fax:  [phone]

## 2019-12-03 ENCOUNTER — Other Ambulatory Visit: Payer: Self-pay

## 2019-12-03 DIAGNOSIS — B20 Human immunodeficiency virus [HIV] disease: Secondary | ICD-10-CM

## 2019-12-04 LAB — T-HELPER CELL (CD4) - (RCID CLINIC ONLY)
CD4 % Helper T Cell: 12 % — ABNORMAL LOW (ref 33–65)
CD4 T Cell Abs: 311 /uL — ABNORMAL LOW (ref 400–1790)

## 2019-12-05 LAB — LIPID PANEL
Cholesterol: 193 mg/dL (ref <200)
HDL: 53 mg/dL (ref >=50)
Non-HDL Cholesterol (Calc): 140 mg/dL (calc) — ABNORMAL HIGH (ref <130)
Total CHOL/HDL Ratio: 3.6 (calc) (ref <5.0)
Triglycerides: 560 mg/dL — ABNORMAL HIGH (ref <150)

## 2019-12-05 LAB — CBC
HCT: 41 % (ref 35.0–45.0)
Hemoglobin: 13.7 g/dL (ref 11.7–15.5)
MCH: 31.4 pg (ref 27.0–33.0)
MCHC: 33.4 g/dL (ref 32.0–36.0)
MCV: 93.8 fL (ref 80.0–100.0)
MPV: 9.6 fL (ref 7.5–12.5)
Platelets: 311 10*3/uL (ref 140–400)
RBC: 4.37 10*6/uL (ref 3.80–5.10)
RDW: 11.9 % (ref 11.0–15.0)
WBC: 6.7 10*3/uL (ref 3.8–10.8)

## 2019-12-05 LAB — COMPREHENSIVE METABOLIC PANEL
AG Ratio: 1.5 (calc) (ref 1.0–2.5)
ALT: 16 U/L (ref 6–29)
AST: 15 U/L (ref 10–35)
Albumin: 4.3 g/dL (ref 3.6–5.1)
Alkaline phosphatase (APISO): 75 U/L (ref 37–153)
BUN: 17 mg/dL (ref 7–25)
CO2: 24 mmol/L (ref 20–32)
Calcium: 9.8 mg/dL (ref 8.6–10.4)
Chloride: 99 mmol/L (ref 98–110)
Creat: 0.92 mg/dL (ref 0.50–0.99)
Globulin: 2.9 g/dL (calc) (ref 1.9–3.7)
Glucose, Bld: 91 mg/dL (ref 65–99)
Potassium: 4.5 mmol/L (ref 3.5–5.3)
Sodium: 132 mmol/L — ABNORMAL LOW (ref 135–146)
Total Bilirubin: 0.3 mg/dL (ref 0.2–1.2)
Total Protein: 7.2 g/dL (ref 6.1–8.1)

## 2019-12-05 LAB — RPR: RPR Ser Ql: NONREACTIVE

## 2019-12-05 LAB — HIV-1 RNA QUANT-NO REFLEX-BLD
HIV 1 RNA Quant: <20 Copies/mL
HIV-1 RNA Quant, Log: <1.3 Log cps/mL

## 2019-12-11 ENCOUNTER — Other Ambulatory Visit: Payer: Self-pay | Admitting: Internal Medicine

## 2019-12-11 DIAGNOSIS — B20 Human immunodeficiency virus [HIV] disease: Secondary | ICD-10-CM

## 2019-12-13 ENCOUNTER — Encounter: Payer: Self-pay | Admitting: Internal Medicine

## 2019-12-18 ENCOUNTER — Other Ambulatory Visit: Payer: Self-pay

## 2019-12-18 ENCOUNTER — Ambulatory Visit (INDEPENDENT_AMBULATORY_CARE_PROVIDER_SITE_OTHER): Payer: Self-pay | Admitting: Internal Medicine

## 2019-12-18 ENCOUNTER — Encounter: Payer: Self-pay | Admitting: Internal Medicine

## 2019-12-18 VITALS — BP 125/83 | HR 80 | Temp 97.9°F | Wt 148.0 lb

## 2019-12-18 DIAGNOSIS — B20 Human immunodeficiency virus [HIV] disease: Secondary | ICD-10-CM

## 2019-12-18 DIAGNOSIS — J302 Other seasonal allergic rhinitis: Secondary | ICD-10-CM

## 2019-12-18 DIAGNOSIS — Z23 Encounter for immunization: Secondary | ICD-10-CM

## 2019-12-18 DIAGNOSIS — F325 Major depressive disorder, single episode, in full remission: Secondary | ICD-10-CM

## 2019-12-18 MED ORDER — DESCOVY 200-25 MG PO TABS: 1.0000 | ORAL_TABLET | Freq: Every day | ORAL | 11 refills | Status: DC

## 2019-12-18 MED ORDER — BECLOMETHASONE DIPROP MONOHYD 42 MCG/SPRAY NA SUSP: 2.0000 | Freq: Every day | NASAL | Status: DC

## 2019-12-18 MED ORDER — PREZCOBIX 800-150 MG PO TABS: 1.0000 | ORAL_TABLET | Freq: Every day | ORAL | 11 refills | Status: DC

## 2019-12-18 NOTE — Assessment & Plan Note (Signed)
She has mild, chronic depression slightly exacerbated by the Covid pandemic.

## 2019-12-18 NOTE — Assessment & Plan Note (Signed)
I will treat her with beclomethasone nasal spray.

## 2019-12-18 NOTE — Progress Notes (Signed)
Patient Active Problem List   Diagnosis Date Noted  . Human immunodeficiency virus (HIV) disease (HCC) 12/31/2005    Priority: High  . Seasonal allergies 12/18/2019  . Depression 09/28/2015  . Anxiety 01/29/2013  . CARPAL TUNNEL SYNDROME 09/10/2009  . 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 DIARRHEA, CHRONIC 07/31/2008  . TINEA VERSICOLOR 12/31/2005  . HYPERLIPIDEMIA 12/31/2005  . ANEMIA-NOS 12/31/2005  . Essential hypertension 12/31/2005  . Allergic rhinitis 12/31/2005    Patient's Medications  New Prescriptions   No medications on file  Previous Medications   CETIRIZINE (ZYRTEC) 10 MG TABLET    Take 10 mg by mouth daily.   CHOLECALCIFEROL (VITAMIN D) 50 MCG (2000 UT) TABLET    Take 2,000 Units by mouth daily.   LISINOPRIL (ZESTRIL) 20 MG TABLET    TAKE 1 TABLET (20 MG TOTAL) BY MOUTH DAILY   LORATADINE 10 MG CAPS    Take 1 capsule (10 mg total) by mouth daily.   MULTIPLE VITAMINS-MINERALS (MULTIVITAMIN WITH MINERALS) TABLET    Take 1 tablet by mouth daily.   SODIUM CHLORIDE (OCEAN) 0.65 % SOLN NASAL SPRAY    Place 1 spray into both nostrils as needed for congestion.   SPECIALTY VITAMINS PRODUCTS (MENOPAUSE SUPPORT PO)    Take 1 tablet by mouth daily.  Modified Medications   Modified Medication Previous Medication   DARUNAVIR-COBICISTAT (PREZCOBIX) 800-150 MG TABLET PREZCOBIX 800-150 MG tablet      Take 1 tablet by mouth daily with breakfast. Swallow whole. Do NOT crush, break or chew tablets. Take with food.    TAKE 1 TABLET BY MOUTH DAILY WITH BREAKFAST. SWALLOW WHOLE. DO NOT CRUSH, BREAK OR CHEW TABLETS. TAKE WITH FOOD   EMTRICITABINE-TENOFOVIR AF (DESCOVY) 200-25 MG TABLET DESCOVY 200-25 MG tablet      Take 1 tablet by mouth daily.    TAKE 1 TABLET BY MOUTH DAILY  Discontinued Medications   No medications on file    Subjective: Tammy Henson is in for her routine HIV follow-up visit.  She recently changed jobs and lost her insurance causing her to be off of her Descovy and  Prezcobix for 1 month.  She returned to her old job and will get her insurance back soon.  She is back on her medication now.  She has not missed any doses when she has had her medication.  She is bothered by sinus congestion and seasonal allergies.  She has had relief from Flonase in the past but knows that she should not take it with her current antiretroviral medication.  She does not want to take a Covid vaccine because of concerns about safety.  Review of Systems: Review of Systems  Constitutional: Negative for chills, diaphoresis, fever, malaise/fatigue and weight loss.  HENT: Positive for congestion. Negative for sore throat.   Respiratory: Negative for cough, sputum production and shortness of breath.   Cardiovascular: Negative for chest pain.  Gastrointestinal: Negative for abdominal pain, diarrhea, heartburn, nausea and vomiting.  Genitourinary: Negative for dysuria and frequency.  Musculoskeletal: Negative for joint pain and myalgias.  Skin: Negative for rash.  Neurological: Negative for dizziness and headaches.  Psychiatric/Behavioral: Positive for depression. Negative for substance abuse. The patient is not nervous/anxious.     Past Medical History:  Diagnosis Date  . Allergic rhinitis 12/31/2005   Qualifier: Diagnosis of  By: Lennox Laity RN, Angelique Blonder    . Human immunodeficiency virus (HIV) disease (HCC) 12/31/2005   Qualifier: Diagnosis of  By: Lennox Laity RN, Angelique Blonder    .  HYPERLIPIDEMIA 12/31/2005   Qualifier: Diagnosis of  By: Lennox Laity RN, Angelique Blonder    . HYPERTENSION 12/31/2005   Qualifier: Diagnosis of  By: Lennox Laity RN, Angelique Blonder    . Vitamin D deficiency 2010    Social History   Tobacco Use  . Smoking status: Former Games developer  . Smokeless tobacco: Never Used  . Tobacco comment: smoked for 5 years, quit age [age]  Vaping Use  . Vaping Use: Never used  Substance Use Topics  . Alcohol use: No    Alcohol/week: 0.0 standard drinks  . Drug use: No    Family History  Problem Relation  Age of Onset  . Hypertension Mother   . Depression Mother   . Heart disease Mother        CHF  . Heart disease Father        first MI in his 45's  . Epilepsy Brother   . HIV Brother   . Hypertension Brother   . Hypercholesterolemia Brother   . HIV Daughter   . Diabetes Neg Hx   . Cancer Neg Hx     No Known Allergies  Health Maintenance  Topic Date Due  . COVID-19 Vaccine (1) Never done  . COLONOSCOPY  10/31/2018  . MAMMOGRAM  01/13/2019  . INFLUENZA VACCINE  09/29/2019  . PAP SMEAR-Modifier  05/06/2021  . TETANUS/TDAP  06/16/2029  . Hepatitis C Screening  Completed  . HIV Screening  Completed    Objective:  Vitals:   12/18/19 0841  BP: 125/83  Pulse: 80  Temp: 97.9 F (36.6 C)  TempSrc: Oral  Weight: 148 lb (67.1 kg)   Body mass index is 25.81 kg/m.  Physical Exam Constitutional:      Comments: She is talkative and in good spirits.  HENT:     Mouth/Throat:     Pharynx: No oropharyngeal exudate.  Eyes:     Conjunctiva/sclera: Conjunctivae normal.  Cardiovascular:     Rate and Rhythm: Normal rate and regular rhythm.     Heart sounds: No murmur heard.   Pulmonary:     Breath sounds: Normal breath sounds.  Abdominal:     Palpations: Abdomen is soft. There is no mass.     Tenderness: There is no abdominal tenderness.  Musculoskeletal:        General: Normal range of motion.  Skin:    Findings: No rash.  Neurological:     Mental Status: She is alert and oriented to person, place, and time.  Psychiatric:        Mood and Affect: Mood normal.     Lab Results Lab Results  Component Value Date   WBC 6.7 12/03/2019   HGB 13.7 12/03/2019   HCT 41.0 12/03/2019   MCV 93.8 12/03/2019   PLT 311 12/03/2019    Lab Results  Component Value Date   CREATININE 0.92 12/03/2019   BUN 17 12/03/2019   NA 132 (L) 12/03/2019   K 4.5 12/03/2019   CL 99 12/03/2019   CO2 24 12/03/2019    Lab Results  Component Value Date   ALT 16 12/03/2019   AST 15  12/03/2019   ALKPHOS 75 06/06/2016   BILITOT 0.3 12/03/2019    Lab Results  Component Value Date   CHOL 193 12/03/2019   HDL 53 12/03/2019   LDLCALC  12/03/2019     Comment:     . LDL cholesterol not calculated. Triglyceride levels greater than 400 mg/dL invalidate calculated LDL results. . Reference range: <100 .  Desirable range <100 mg/dL for primary prevention;   <70 mg/dL for patients with CHD or diabetic patients  with > or = 2 CHD risk factors. Marland Kitchen LDL-C is now calculated using the Martin-Hopkins  calculation, which is a validated novel method providing  better accuracy than the Friedewald equation in the  estimation of LDL-C.  Horald Pollen et al. Lenox Ahr. 3536;144(31): 2061-2068  (http://education.[facility].com/faq/FAQ164)    TRIG 560 (H) 12/03/2019   CHOLHDL 3.6 12/03/2019   Lab Results  Component Value Date   LABRPR NON-REACTIVE 12/03/2019   HIV 1 RNA Quant  Date Value  12/03/2019 <20 Copies/mL  11/26/2018 36 copies/mL (H)  04/20/2018 <20 NOT DETECTED copies/mL   CD4 T Cell Abs (/uL)  Date Value  12/03/2019 311 (L)  11/26/2018 234 (L)  04/20/2018 580     Problem List Items Addressed This Visit      High   Human immunodeficiency virus (HIV) disease (HCC)    Despite being off of her medication recently her infection remains under excellent control.  She will continue her current antiretroviral regimen and follow-up in 1 year.      Relevant Medications   emtricitabine-tenofovir AF (DESCOVY) 200-25 MG tablet   darunavir-cobicistat (PREZCOBIX) 800-150 MG tablet   Other Relevant Orders   CBC   T-helper cell (CD4)- ([facility] clinic only)   Comprehensive metabolic panel   Lipid panel   RPR   HIV-1 RNA quant-no reflex-bld     Unprioritized   Seasonal allergies    I will treat her with beclomethasone nasal spray.      Relevant Medications   beclomethasone (BECONASE-AQ) nasal spray 2 spray (Start on 12/18/2019 10:00 AM)   Depression    She has mild,  chronic depression slightly exacerbated by the Covid pandemic.           Cliffton Asters, MD [facility] [phone] pager   [phone] cell 12/18/2019, 9:10 AM

## 2019-12-18 NOTE — Assessment & Plan Note (Signed)
Despite being off of her medication recently her infection remains under excellent control.  She will continue her current antiretroviral regimen and follow-up in 1 year.

## 2020-01-13 ENCOUNTER — Other Ambulatory Visit: Payer: Self-pay | Admitting: Internal Medicine

## 2020-01-13 DIAGNOSIS — I10 Essential (primary) hypertension: Secondary | ICD-10-CM

## 2020-02-24 ENCOUNTER — Other Ambulatory Visit: Payer: Self-pay | Admitting: Internal Medicine

## 2020-02-24 DIAGNOSIS — B20 Human immunodeficiency virus [HIV] disease: Secondary | ICD-10-CM

## 2020-04-13 ENCOUNTER — Other Ambulatory Visit: Payer: Self-pay | Admitting: Internal Medicine

## 2020-04-13 DIAGNOSIS — I10 Essential (primary) hypertension: Secondary | ICD-10-CM

## 2020-05-16 ENCOUNTER — Other Ambulatory Visit: Payer: Self-pay | Admitting: Internal Medicine

## 2020-05-16 DIAGNOSIS — I10 Essential (primary) hypertension: Secondary | ICD-10-CM

## 2020-05-18 ENCOUNTER — Other Ambulatory Visit: Payer: Self-pay

## 2020-05-18 DIAGNOSIS — I10 Essential (primary) hypertension: Secondary | ICD-10-CM

## 2020-05-18 MED ORDER — LISINOPRIL 20 MG PO TABS: ORAL_TABLET | ORAL | 0 refills | Status: DC

## 2020-06-13 NOTE — Progress Notes (Deleted)
Tammy Henson is a 60 y.o. female who presents for a complete physical.    She has HIV and is sees Dr. Orvan Falconer yearly, last in 11/2019.  At that time she was also prescribed Beconase nasal spray for allergies.   She is compliant with her HIV medications and blood tests were normal. To be repeated in 11/2020.  Hypertension:  Compliant with lisinopril.  Denies side effects or cough.  Doesn't monitor BP elsewhere.  No headaches (except sinus), chest pain, palpitations, edema. (Last year, review of paper chart showed that she had cough in 2012, changed to losartan; doing well on lisinopril for years since) BP Readings from Last 3 Encounters:  12/18/19 125/83  06/17/19 130/80  12/11/18 127/82   Elevated TG: she is NOT fasting for labs done at ID clinic.  She WAS fasting for 05/2019 labs, TG 216.  Component Ref Range & Units 6 mo ago  (12/03/19) 11 mo ago  (06/20/19) 2 yr ago  (04/20/18) 4 yr ago  (06/06/16) 5 yr ago  (04/07/15) 5 yr ago  (07/14/14) 6 yr ago  (09/11/13)  Cholesterol <200 mg/dL 496   759  163  846 R  164 R, CM  189 R, CM   HDL > OR = 50 mg/dL 53  46 R  65LDJ  59 R  60 R  61 R, CM  64 R   Triglycerides <150 mg/dL 570VXBL  390ZESP R  233AQTM CM  335High  204High  166High  245High     Immunization History  Administered Date(s) Administered  . Hepatitis B 05/18/2004, 07/20/2004, 02/08/2005  . Influenza Split 03/01/2011, 01/25/2012  . Influenza Whole 02/08/2005, 02/14/2006, 12/30/2006, 12/05/2007  . Influenza,inj,Quad PF,6+ Mos 01/29/2013, 10/23/2013, 12/23/2014, 10/17/2016, 11/01/2017, 12/11/2018, 12/18/2019  . Pneumococcal Conjugate-13 06/17/2019  . Pneumococcal Polysaccharide-23 05/18/2004, 09/10/2009  . Tdap 06/17/2019   Last Pap smear: 04/2018 (and told next due 2023) Last mammogram: 12/2016 Last colonoscopy: 10/2008, Dr. Evette Cristal.  Cologuard ordered last year. Last DEXA: never Dentist: past due, plans to schedule. Ophtho:  Exercise: None  recently.  Vitamin D-OH 32.7 in 05/2019   PMH, PSH, SH and FH were reviewed and updated.   ROS:  The patient denies anorexia, fever, weight changes, headaches,  vision changes, decreased hearing, ear pain, sore throat, breast concerns, chest pain, palpitations, dizziness, syncope, dyspnea on exertion, cough, swelling, nausea, vomiting, diarrhea (just occasional, related to stress, diet), constipation, abdominal pain, melena, hematochezia, indigestion/heartburn, hematuria, incontinence, dysuria, vaginal bleeding, discharge, odor or itch, genital lesions, joint pains, numbness, tingling, weakness, tremor, suspicious skin lesions,abnormal bleeding/bruising, or enlarged lymph nodes. Slight sinus pressure intermittently, related to the pollen. Mucus is clear.  Every now and then has some trouble sleeping.  Sunday nights are the hardest, due to going to work on Monday. Sometimes uses "natural calm" which helps (magnesium). Has some dental problems--needs extractions and probably dentures.   PHYSICAL EXAM:   Wt Readings from Last 3 Encounters:  12/18/19 148 lb (67.1 kg)  06/17/19 144 lb (65.3 kg)  06/12/17 141 lb (64 kg)    General Appearance:    Alert, cooperative, no distress, appears stated age  Head:    Normocephalic, without obvious abnormality, atraumatic  Eyes:    PERRL, conjunctiva/corneas clear, EOM's intact, fundi    benign  Ears:    Normal TM's and external ear canals  Nose:   Not examined, wearing mask due to COVID19 pandemic  Throat:   Not examined, wearing mask due to COVID19 pandemic  Neck:  Supple, no lymphadenopathy;  thyroid:  no enlargement/ tenderness/nodules; no carotid bruit or JVD  Back:    Spine nontender, no curvature, ROM normal, no CVA    tenderness. 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 This is nontender (and she reports mild  gradual growth over years)  Lungs:     Clear to auscultation bilaterally without wheezes, rales or     ronchi; respirations unlabored  Chest Wall:    No tenderness or deformity. Nontender along ribs   Heart:    Regular rate and rhythm, S1 and S2 normal, no murmur, rub   or gallop  Breast Exam:    No tenderness, masses, or nipple discharge or inversion.      No axillary lymphadenopathy  Abdomen:     Soft, non-tender, nondistended, normoactive bowel sounds,    no masses, no hepatosplenomegaly  Genitalia:    Normal external genitalia without lesions.  BUS and vagina normal; no cervical motion tenderness. No abnormal vaginal discharge.  Uterus and adnexa not enlarged, nontender, no masses.  Pap not performed  Rectal:    Normal tone, no masses or tenderness; guaiac negative stool. There are two areas that are enlarged, the posterior portion is significantly larger  It is soft, nontender.  Doesn't appear vascular currently (though she feels like they are hemorrhoids that get more inflamed when she has diarrhea, asymptomatic now).  Extremities:   No clubbing, cyanosis or edema  Pulses:   2+ and symmetric all extremities  Skin:   Skin color, texture, turgor normal, no rashes or lesions. Erythema at her upper back (?related to sun)  Lymph nodes:   Cervical, supraclavicular, and axillary nodes normal  Neurologic:   CNII-XII intact, normal strength, sensation and gait; reflexes 2+ and symmetric throughout          Psych:   Normal mood, affect, hygiene and grooming.    Update rectal exam  ASSESSMENT/PLAN:  ANY COVID VACCINES?? Shingrix rec MAMMO? DID SHE EVER RETURN COLOGUARD?  Discussed monthly self breast exams and yearly mammograms (past due); at least 30 minutes of aerobic activity at least 5 days/week, weight-bearing exercise at least 2x/week; proper sunscreen use reviewed; healthy diet, including goals of calcium and vitamin D intake and alcohol recommendations (less than or equal to 1 drink/day)  reviewed; regular seatbelt use; changing batteries in smoke detectors, carbon monoxide detectors recommended.  Immunization recommendations discussed--COVID vaccine recommended.. Shingrix recommended--to wait 4 weeks after last COVID vaccine, and schedule NV after checking with her insurance.  Colonoscopy recommendations reviewed, past due.Cologuard ordered last year, never returned.  F/u 1 year

## 2020-06-13 NOTE — Patient Instructions (Incomplete)
  HEALTH MAINTENANCE RECOMMENDATIONS:  It is recommended that you get at least 30 minutes of aerobic exercise at least 5 days/week (for weight loss, you may need as much as 60-90 minutes). This can be any activity that gets your heart rate up. This can be divided in 10-15 minute intervals if needed, but try and build up your endurance at least once a week.  Weight bearing exercise is also recommended twice weekly.  Eat a healthy diet with lots of vegetables, fruits and fiber.  "Colorful" foods have a lot of vitamins (ie green vegetables, tomatoes, red peppers, etc).  Limit sweet tea, regular sodas and alcoholic beverages, all of which has a lot of calories and sugar.  Up to 1 alcoholic drink daily may be beneficial for women (unless trying to lose weight, watch sugars).  Drink a lot of water.  Calcium recommendations are 1200-1500 mg daily (1500 mg for postmenopausal women or women without ovaries), and vitamin D 1000 IU daily.  This should be obtained from diet and/or supplements (vitamins), and calcium should not be taken all at once, but in divided doses.  Monthly self breast exams and yearly mammograms for women over the age of 65 is recommended.  Sunscreen of at least SPF 30 should be used on all sun-exposed parts of the skin when outside between the hours of 10 am and 4 pm (not just when at beach or pool, but even with exercise, golf, tennis, and yard work!)  Use a sunscreen that says "broad spectrum" so it covers both UVA and UVB rays, and make sure to reapply every 1-2 hours.  Remember to change the batteries in your smoke detectors when changing your clock times in the spring and fall. Carbon monoxide detectors are recommended for your home.  Use your seat belt every time you are in a car, and please drive safely and not be distracted with cell phones and texting while driving.  You are past due for mammogram.  Please schedule one. You are past due for colon cancer screening.  I recommend  getting the new shingles vaccine (Shingrix). Check with your insurance to verify what your out of pocket cost may be (usually covered as preventative, but better to verify to avoid any surprises, as this vaccine is expensive), and then schedule a nurse visit at our office when convenient (based on the possible side effects as discussed).   This is a series of 2 injections, spaced 2 months apart.  It doesn't have to be exactly 2 months apart (but can't be under 2 months), if that isn't feasible for your schedule, but try and get them close to 2 months (and definitely within 6 months of each other, or else the efficacy of the vaccine drops off).

## 2020-06-16 ENCOUNTER — Other Ambulatory Visit: Payer: Self-pay | Admitting: Internal Medicine

## 2020-06-16 DIAGNOSIS — I10 Essential (primary) hypertension: Secondary | ICD-10-CM

## 2020-06-17 ENCOUNTER — Encounter: Payer: 59 | Admitting: Family Medicine

## 2020-06-17 DIAGNOSIS — J302 Other seasonal allergic rhinitis: Secondary | ICD-10-CM

## 2020-06-17 DIAGNOSIS — B20 Human immunodeficiency virus [HIV] disease: Secondary | ICD-10-CM

## 2020-06-17 DIAGNOSIS — E781 Pure hyperglyceridemia: Secondary | ICD-10-CM

## 2020-06-17 DIAGNOSIS — I10 Essential (primary) hypertension: Secondary | ICD-10-CM

## 2020-06-17 DIAGNOSIS — Z0000 Encounter for general adult medical examination without abnormal findings: Secondary | ICD-10-CM

## 2020-06-17 DIAGNOSIS — Z1211 Encounter for screening for malignant neoplasm of colon: Secondary | ICD-10-CM

## 2020-07-12 ENCOUNTER — Other Ambulatory Visit: Payer: Self-pay | Admitting: Internal Medicine

## 2020-07-12 DIAGNOSIS — I10 Essential (primary) hypertension: Secondary | ICD-10-CM

## 2020-08-04 ENCOUNTER — Other Ambulatory Visit: Payer: Self-pay

## 2020-08-04 DIAGNOSIS — J302 Other seasonal allergic rhinitis: Secondary | ICD-10-CM

## 2020-08-04 MED ORDER — BECLOMETHASONE DIPROP MONOHYD 42 MCG/SPRAY NA SUSP
2.0000 | Freq: Two times a day (BID) | NASAL | 3 refills | Status: DC
Start: 2020-08-04 — End: 2020-10-28

## 2020-08-04 NOTE — Telephone Encounter (Signed)
MD attempted to place original order, but selected clinic administered. Rx updated and send electronically to pharmacy with 3 refills. Patient to have PCP continue to fill afterwards.  Tammy Henson

## 2020-08-16 ENCOUNTER — Other Ambulatory Visit: Payer: Self-pay | Admitting: Internal Medicine

## 2020-08-16 DIAGNOSIS — I10 Essential (primary) hypertension: Secondary | ICD-10-CM

## 2020-08-19 ENCOUNTER — Other Ambulatory Visit: Payer: Self-pay | Admitting: Family Medicine

## 2020-08-19 DIAGNOSIS — I10 Essential (primary) hypertension: Secondary | ICD-10-CM

## 2020-08-19 NOTE — Telephone Encounter (Signed)
Left message for patient to please call office. Dr Lynelle Doctor has never rx'd this medication for patient and she is past due for an appointment.

## 2020-08-28 ENCOUNTER — Telehealth: Payer: Self-pay

## 2020-08-28 ENCOUNTER — Other Ambulatory Visit: Payer: Self-pay | Admitting: Internal Medicine

## 2020-08-28 DIAGNOSIS — I10 Essential (primary) hypertension: Secondary | ICD-10-CM

## 2020-08-28 MED ORDER — LISINOPRIL 20 MG PO TABS: 20.0000 mg | ORAL_TABLET | Freq: Every day | ORAL | 0 refills | Status: DC

## 2020-08-28 NOTE — Telephone Encounter (Signed)
Patient requesting lisinopril refill. Advised her that 30 day supply will be sent in, but that it would be best for her PCP to manage this going forward. Patient verbalized understanding and has no further questions.   Sandie Ano, RN

## 2020-09-19 ENCOUNTER — Other Ambulatory Visit: Payer: Self-pay | Admitting: Internal Medicine

## 2020-09-19 DIAGNOSIS — I10 Essential (primary) hypertension: Secondary | ICD-10-CM

## 2020-09-21 NOTE — Telephone Encounter (Signed)
Left vm for patient to call [facility] regarding electronic refill request. Patient was given 30 days of Lisinopril and asked to get a PCP to take over. Need to verify PCP and have them take over this medication as it was a one time fill.

## 2020-09-28 ENCOUNTER — Other Ambulatory Visit: Payer: Self-pay | Admitting: Internal Medicine

## 2020-09-28 DIAGNOSIS — I10 Essential (primary) hypertension: Secondary | ICD-10-CM

## 2020-09-28 NOTE — Telephone Encounter (Signed)
Please advise on refill.

## 2020-09-29 ENCOUNTER — Other Ambulatory Visit: Payer: Self-pay

## 2020-09-29 ENCOUNTER — Other Ambulatory Visit: Payer: Self-pay | Admitting: Internal Medicine

## 2020-09-29 DIAGNOSIS — I10 Essential (primary) hypertension: Secondary | ICD-10-CM

## 2020-09-29 MED ORDER — LISINOPRIL 20 MG PO TABS: 20.0000 mg | ORAL_TABLET | Freq: Every day | ORAL | 0 refills | Status: DC

## 2020-09-29 NOTE — Telephone Encounter (Signed)
Called patient regarding refill. Does not have a PCP at this time. Provided number for IM to establish care. Understands office will only send 30 day supply. Juanita Laster, RMA

## 2020-10-21 ENCOUNTER — Other Ambulatory Visit: Payer: Self-pay | Admitting: Internal Medicine

## 2020-10-21 DIAGNOSIS — I10 Essential (primary) hypertension: Secondary | ICD-10-CM

## 2020-10-27 NOTE — Patient Instructions (Addendum)
HEALTH MAINTENANCE RECOMMENDATIONS:  It is recommended that you get at least 30 minutes of aerobic exercise at least 5 days/week (for weight loss, you may need as much as 60-90 minutes). This can be any activity that gets your heart rate up. This can be divided in 10-15 minute intervals if needed, but try and build up your endurance at least once a week.  Weight bearing exercise is also recommended twice weekly.  Eat a healthy diet with lots of vegetables, fruits and fiber.  "Colorful" foods have a lot of vitamins (ie green vegetables, tomatoes, red peppers, etc).  Limit sweet tea, regular sodas and alcoholic beverages, all of which has a lot of calories and sugar.  Up to 1 alcoholic drink daily may be beneficial for women (unless trying to lose weight, watch sugars).  Drink a lot of water.  Calcium recommendations are 1200-1500 mg daily (1500 mg for postmenopausal women or women without ovaries), and vitamin D 1000 IU daily.  This should be obtained from diet and/or supplements (vitamins), and calcium should not be taken all at once, but in divided doses.  Monthly self breast exams and yearly mammograms for women over the age of 25 is recommended.  Sunscreen of at least SPF 30 should be used on all sun-exposed parts of the skin when outside between the hours of 10 am and 4 pm (not just when at beach or pool, but even with exercise, golf, tennis, and yard work!)  Use a sunscreen that says "broad spectrum" so it covers both UVA and UVB rays, and make sure to reapply every 1-2 hours.  Remember to change the batteries in your smoke detectors when changing your clock times in the spring and fall. Carbon monoxide detectors are recommended for your home.  Use your seat belt every time you are in a car, and please drive safely and not be distracted with cell phones and texting while driving.  You are past due for mammogram.  These are recommended yearly to screen for breast cancer.  You haven't had one  since 2018.  Please call and schedule appointment (either with [facility], who did your prior mammograms), or wherever is now convenient for you--[facility], [facility], or [facility]).  You will be due for your next pap smear in 04/2020.  Please schedule appt with the NP at the ID clinic to have this done, as well as your breast exam, since not done today.  You are past due for colon cancer screening. We put in a referral for [facility] for colonoscopy.  COVID vaccination is highly recommended. Since you aren't interested in this, be sure to test early (and repeat a couple of days later if negative) with any symptoms, as any treatments are time-sensitive (need to be started within 5 days of symptoms).  Contact ID if you are positive for treatment recommendations.  If stress/mood/feelings of being overwhelmed don't improve, consider counseling.  I would complete your course of Augmentin (sinus infections require longer than 5 days), and take a probiotic along with it for your stomach. If having diarrhea, avoid eating dairy.  Ask the pharmacist which over-the-counter inhaled steroid medications are safe for you to take (ie Rhinocort, nasonex, nasacort, etc--whichever are available at the pharmacy without prescription) Taking daily nasal steroid should help prevent frequent sinus infection.  We discussed calcium CT scoring/scan to evaluate your cardiac risks, given family history. If you are interested, let us know (you can check with your insurance, vs do the cash-pay discounted  price).   Coronary Calcium Scan A coronary calcium scan is an imaging test used to look for deposits of plaque in the inner lining of the blood vessels of the heart (coronary arteries). Plaque is made up of calcium, protein, and fatty substances. These deposits of plaque can partly clog and narrow the coronary arteries without producing any symptoms or warning signs. This puts a person at risk for a heart attack. This test is  recommended for people who are at moderate risk for heart disease. The test can find plaque deposits before symptoms develop. Tell a health care provider about: Any allergies you have. All medicines you are taking, including vitamins, herbs, eye drops, creams, and over-the-counter medicines. Any problems you or family members have had with anesthetic medicines. Any blood disorders you have. Any surgeries you have had. Any medical conditions you have. Whether you are pregnant or may be pregnant. What are the risks? Generally, this is a safe procedure. However, problems may occur, including: Harm to a pregnant woman and her unborn baby. This test involves the use of radiation. Radiation exposure can be dangerous to a pregnant woman and her unborn baby. If you are pregnant or think you may be pregnant, you should not have this procedure done. Slight increase in the risk of cancer. This is because of the radiation involved in the test. What happens before the procedure? Ask your health care provider for any specific instructions on how to prepare for this procedure. You may be asked to avoid products that contain caffeine, tobacco, or nicotine for 4 hours before the procedure. What happens during the procedure?  You will undress and remove any jewelry from your neck or chest. You will put on a hospital gown. Sticky electrodes will be placed on your chest. The electrodes will be connected to an electrocardiogram (ECG) machine to record a tracing of the electrical activity of your heart. You will lie down on a curved bed that is attached to the CT scanner. You may be given medicine to slow down your heart rate so that clear pictures can be created. You will be moved into the CT scanner, and the CT scanner will take pictures of your heart. During this time, you will be asked to lie still and hold your breath for 2-3 seconds at a time while each picture of your heart is being taken. The procedure may  vary among health care providers and hospitals. What happens after the procedure? You can get dressed. You can return to your normal activities. It is up to you to get the results of your procedure. Ask your health care provider, or the department that is doing the procedure, when your results will be ready. Summary A coronary calcium scan is an imaging test used to look for deposits of plaque in the inner lining of the blood vessels of the heart (coronary arteries). Plaque is made up of calcium, protein, and fatty substances. Generally, this is a safe procedure. Tell your health care provider if you are pregnant or may be pregnant. Ask your health care provider for any specific instructions on how to prepare for this procedure. A CT scanner will take pictures of your heart. You can return to your normal activities after the scan is done. This information is not intended to replace advice given to you by your health care provider. Make sure you discuss any questions you have with your health care provider. Document Revised: 09/04/2018 Document Reviewed: 09/04/2018 Elsevier Patient Education  2022  [employer].

## 2020-10-27 NOTE — Progress Notes (Signed)
Chief Complaint  Patient presents with   Annual Exam    Fasting annual exam no pap-will do at ID clinic March. Will do pneumovax and flu shots today and will come back on a Friday at some point for shingrix (NV). Did not return Cologuard, prefers to see GI (Dr. Kenney Houseman told her he retired-she will call and see who is taking over for him). Will call and schedule mammogram. Feels like she is having some hormonal issues-stress of life and feeling overwhelmed at times. Would like to know if you can take over her lisinopril-Dr.Campbell no longer will refill.     Tammy Henson is a 60 y.o. female who presents for a complete physical.  Last visit here was 05/2019.  She continues to be followed by Dr. Orvan Falconer for her HIV, seen yearly in October.  They have been refilling her lisinopril since her prescription from our office expired in 05/2020.  She has some stress--related to work, rent hikes.  She will be moving in with her daughters, which will help with financial stress. She has been living alone for 2 years, which she hasn't liked.  Thinks things will improve when she moves (end September). Son will be getting married in October.  Hypertension:  Compliant with lisinopril.  Denies side effects or cough, other than related to PND/allergies.  Doesn't monitor BP elsewhere.  No headaches (except sinus), chest pain, palpitations, edema. She has family h/o heart disease and is concerned about whether any testing is needed. They were all smokers. She has no symptoms.  Elevated triglycerides:  She has lipids checked yearly through ID clinic, and is never fasting for that bloodwork.  We checked fasting lipids in 05/2019 and TG were elevated at 216. She was asked to take omega-3 fish oil, and follow lowfat diet.  Due for recheck. She is fasting today. She had been taking fish oil regularly, but ran out at least 6 months ago. Doesn't eat much fried foods (infrequent), has some sweets.  Lab Results  Component  Value Date   CHOL 193 12/03/2019   HDL 53 12/03/2019   LDLCALC  12/03/2019     Comment:     . LDL cholesterol not calculated. Triglyceride levels greater than 400 mg/dL invalidate calculated LDL results. . Reference range: <100 . Desirable range <100 mg/dL for primary prevention;   <70 mg/dL for patients with CHD or diabetic patients  with > or = 2 CHD risk factors. Marland Kitchen LDL-C is now calculated using the Martin-Hopkins  calculation, which is a validated novel method providing  better accuracy than the Friedewald equation in the  estimation of LDL-C.  Horald Pollen et al. Lenox Ahr. 6629;476(54): 2061-2068  (http://education.[facility].com/faq/FAQ164)    TRIG 560 (H) 12/03/2019   CHOLHDL 3.6 12/03/2019   Allergic rhinitis:  Dr. Orvan Falconer prescribed beconase nasal spray--it was too expensive. She used to take Flonase (and was very effective), but when HIV meds were changed, was told she could no longer take that medications. She takes zyrtec daily.    She went to urgent care last week for "bad sinuses", headaches, ears plugged, drainage.  She was treated with Augmentin--she didn't like how it made her feel, upset her stomach, so she stopped taking it, only took it for 5-6 days. She feels much better, just a little PND. She reports they didn't do any COVID testing.   Immunization History  Administered Date(s) Administered   Hepatitis B 05/18/2004, 07/20/2004, 02/08/2005   Influenza Split 03/01/2011, 01/25/2012   Influenza Whole 02/08/2005,  02/14/2006, 12/30/2006, 12/05/2007   Influenza,inj,Quad PF,6+ Mos 01/29/2013, 10/23/2013, 12/23/2014, 10/17/2016, 11/01/2017, 12/11/2018, 12/18/2019   Pneumococcal Conjugate-13 06/17/2019   Pneumococcal Polysaccharide-23 05/18/2004, 09/10/2009   Tdap 06/17/2019   Last Pap smear: 04/2018 (next due 2023), done at ID clinic Last mammogram: 12/2016 (past due, they used to come to her job). Last colonoscopy: 10/2008, Dr. Evette CristalGanem, past due. Referred for  Cologuard last year, never returned. [facility]. Last DEXA: never Dentist: past due, over a year ago.  Going to look into [facility]. Ophtho: last year Exercise: None recently.  Vitamin D level normal at 32.7 in 05/2019   PMH, PSH, SH and FH were reviewed and updated.  Outpatient Encounter Medications as of 10/28/2020  Medication Sig Note   Beta Carotene (VITAMIN A) 25000 UNIT capsule Take 25,000 Units by mouth daily.    cetirizine (ZYRTEC) 10 MG tablet Take 10 mg by mouth daily.    Cholecalciferol (VITAMIN D) 50 MCG (2000 UT) tablet Take 2,000 Units by mouth daily.    darunavir-cobicistat (PREZCOBIX) 800-150 MG tablet Take 1 tablet by mouth daily with breakfast. Swallow whole. Do NOT crush, break or chew tablets. Take with food.    emtricitabine-tenofovir AF (DESCOVY) 200-25 MG tablet Take 1 tablet by mouth daily.    lisinopril (ZESTRIL) 20 MG tablet Take 1 tablet (20 mg total) by mouth daily.    Multiple Vitamins-Minerals (MULTIVITAMIN WITH MINERALS) tablet Take 1 tablet by mouth daily. 06/17/2019: Herbal Life   Specialty Vitamins Products (MENOPAUSE SUPPORT PO) Take 1 tablet by mouth daily. 10/28/2020: Herbal Life   [DISCONTINUED] baclofen (LIORESAL) 10 MG tablet Take by mouth.    sodium chloride (OCEAN) 0.65 % SOLN nasal spray Place 1 spray into both nostrils as needed for congestion. (Patient not taking: Reported on 10/28/2020)    [DISCONTINUED] amoxicillin-clavulanate (AUGMENTIN) 875-125 MG tablet Take 1 tablet by mouth 2 (two) times daily. (Patient not taking: Reported on 10/28/2020)    [DISCONTINUED] beclomethasone (BECONASE-AQ) 42 MCG/SPRAY nasal spray Place 2 sprays into both nostrils 2 (two) times daily. Dose is for each nostril.    [DISCONTINUED] Loratadine 10 MG CAPS Take 1 capsule (10 mg total) by mouth daily. 06/12/2017: prn   No facility-administered encounter medications on file as of 10/28/2020.   Was just taking Augmentin, took  for about 5-6 days, started on 8/22)  No Known Allergies   ROS:  The patient denies anorexia, fever, weight changes, headaches,  vision changes, decreased hearing, ear pain, sore throat, breast concerns, chest pain, palpitations, dizziness, syncope, dyspnea on exertion, cough, swelling, nausea, vomiting, diarrhea (just occasional, related to stress, diet, possibly medications, worse with augmentin), constipation, abdominal pain, melena, hematochezia, indigestion/heartburn, hematuria, incontinence, dysuria, vaginal bleeding, discharge, odor or itch, genital lesions, joint pains, numbness, tingling, weakness, tremor, suspicious skin lesions,abnormal bleeding/bruising, or enlarged lymph nodes.  Stressors, feeling a little overwhelmed per HPI. Sometimes uses "natural calm" (magnesium) when feels a little overwhelmed. Has some dental problems--needs extractions and probably dentures. Financial limitations. Frequent sinus problems, currently better since recent ABX.   PHYSICAL EXAM:  BP 120/84   Pulse 80   Ht 5' 2.5" (1.588 m)   Wt 146 lb (66.2 kg)   BMI 26.28 kg/m   Wt Readings from Last 3 Encounters:  10/28/20 146 lb (66.2 kg)  12/18/19 148 lb (67.1 kg)  06/17/19 144 lb (65.3 kg)    General Appearance:    Alert, cooperative, no distress, appears stated age. She elected not to get  undressed today (will be having GYN/breast exam in March at ID clinic)  Head:    Normocephalic, without obvious abnormality, atraumatic  Eyes:    PERRL, conjunctiva/corneas clear, EOM's intact, fundi    benign  Ears:    Normal TM's and external ear canals  Nose:   Mild edema, white stringy mucus in L nares. No erythema.   Throat:   Not examined, wearing mask due to COVID19 pandemic  Neck:   Supple, no lymphadenopathy;  thyroid:  no enlargement/ tenderness/nodules; no carotid bruit or JVD  Back:    Spine nontender, no curvature, ROM normal, no CVA    tenderness. 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  There is some mild erythema over this area, which blanches easily--more pink over lipoma, slight pink elsewhere at neckline, possibly from recent sun. Nontender. Pt denies change in size (poss smaller, no growth).  Lungs:     Clear to auscultation bilaterally without wheezes, rales or     ronchi; respirations unlabored  Chest Wall:    No tenderness or deformity. Nontender along ribs   Heart:    Regular rate and rhythm, S1 and S2 normal, no murmur, rub   or gallop  Breast Exam:    Deferred  Abdomen:     Soft, non-tender, nondistended, normoactive bowel sounds,    no masses, no hepatosplenomegaly  Genitalia:    Deferred  Rectal:    Deferred/declined.  Extremities:   No clubbing, cyanosis or edema  Pulses:   2+ and symmetric all extremities  Skin:   Skin color, texture, turgor normal, no rashes or lesions. Mild erythema/pink at back of neck, more prominent overlying lipoma.  Lymph nodes:   Cervical, supraclavicular, inguinal nodes normal  Neurologic:   Normal strength, sensation and gait; reflexes 2+ and symmetric throughout          Psych:   Normal mood, affect, hygiene and grooming.    PHQ-9 score of 4    ASSESSMENT/PLAN:  Annual physical exam - Plan: POCT Urinalysis DIP (Proadvantage Device)  Hypertriglyceridemia - counseled re: lowfat diet. If TG elevated, will encourage her to resume omega-3 fish oil, and to fast when she has labs done for ID next month for recheck - Plan: Lipid panel  Human immunodeficiency virus (HIV) disease (HCC) - compliant with meds, f/b ID  Colon cancer screening - Plan: Ambulatory referral to Gastroenterology  Allergic rhinitis, unspecified seasonality, unspecified trigger - encouraged nasal steroid spray--d/w pharmacist re: med interactions. Sinus rinses, mucinex prn. complete Augmentin  Need for pneumococcal vaccination - Plan:  Pneumococcal polysaccharide vaccine 23-valent greater than or equal to 2yo subcutaneous/IM  Essential hypertension, benign - well controlled - Plan: lisinopril (ZESTRIL) 20 MG tablet  Need for influenza vaccination - Plan: Flu Vaccine QUAD 6+ mos PF IM (Fluarix Quad PF)  Family history of heart disease - family smoked; she is asymptomatic. Rec EKG given HTN, declined today.  Discussed Ca scan, will consider  Vaccine counseling - counseled re: risks/SE of Shingrix, flu and pneumovax. Counseled re: COVID vaccines, and conversation re: early dx for treatment.  Counseled re: her feelings of overwhelmed--likely will improve once she moves. Discussed counseling if not improving as expected.  Declined EKG today (didn't want to undress)--do next year.  Has been getting paps through ID clinic, q3 years, due again 04/2021.  Review of 04/2018 note showed they  also did breast exam.  Pt declined to get undressed--will have breast/pelvic exam done in March at ID clinic.  Counseled re: doing more frequent COVID tests with her allergy/sinus symptoms, which can overlap with COVID symptoms.  Discussed early diagnosis for early treatment, to prevent complicated disease, since she is unvaccinated.  Discussed monthly self breast exams and yearly mammograms (past due); at least 30 minutes of aerobic activity at least 5 days/week, weight-bearing exercise at least 2x/week; proper sunscreen use reviewed; healthy diet, including goals of calcium and vitamin D intake and alcohol recommendations (less than or equal to 1 drink/day) reviewed; regular seatbelt use; changing batteries in smoke detectors, carbon monoxide detectors recommended.  Immunization recommendations discussed--Pneumovax and flu shot given today. Shingrix recommended--to wait 4 weeks after other vaccines, schedule NV after checking with her insurance. COVID vaccines encouraged, declined. Colonoscopy recommendations reviewed, past due, never returned cologuard  ordered 05/2019.  Referred back to [facility] today.

## 2020-10-28 ENCOUNTER — Encounter: Payer: Self-pay | Admitting: Family Medicine

## 2020-10-28 ENCOUNTER — Other Ambulatory Visit: Payer: Self-pay

## 2020-10-28 ENCOUNTER — Ambulatory Visit: Payer: 59 | Admitting: Family Medicine

## 2020-10-28 VITALS — BP 120/84 | HR 80 | Ht 62.5 in | Wt 146.0 lb

## 2020-10-28 DIAGNOSIS — Z7185 Encounter for immunization safety counseling: Secondary | ICD-10-CM

## 2020-10-28 DIAGNOSIS — Z8249 Family history of ischemic heart disease and other diseases of the circulatory system: Secondary | ICD-10-CM

## 2020-10-28 DIAGNOSIS — I10 Essential (primary) hypertension: Secondary | ICD-10-CM | POA: Diagnosis not present

## 2020-10-28 DIAGNOSIS — B20 Human immunodeficiency virus [HIV] disease: Secondary | ICD-10-CM

## 2020-10-28 DIAGNOSIS — Z1211 Encounter for screening for malignant neoplasm of colon: Secondary | ICD-10-CM

## 2020-10-28 DIAGNOSIS — Z0000 Encounter for general adult medical examination without abnormal findings: Secondary | ICD-10-CM | POA: Diagnosis not present

## 2020-10-28 DIAGNOSIS — E781 Pure hyperglyceridemia: Secondary | ICD-10-CM | POA: Diagnosis not present

## 2020-10-28 DIAGNOSIS — Z23 Encounter for immunization: Secondary | ICD-10-CM | POA: Diagnosis not present

## 2020-10-28 DIAGNOSIS — J309 Allergic rhinitis, unspecified: Secondary | ICD-10-CM | POA: Diagnosis not present

## 2020-10-28 LAB — POCT URINALYSIS DIP (PROADVANTAGE DEVICE)
Bilirubin, UA: NEGATIVE
Blood, UA: NEGATIVE
Glucose, UA: NEGATIVE mg/dL
Leukocytes, UA: NEGATIVE
Nitrite, UA: NEGATIVE
Specific Gravity, Urine: 1.02
Urobilinogen, Ur: NEGATIVE
pH, UA: 6 (ref 5.0–8.0)

## 2020-10-28 MED ORDER — LISINOPRIL 20 MG PO TABS: 20.0000 mg | ORAL_TABLET | Freq: Every day | ORAL | 3 refills | Status: DC

## 2020-10-29 ENCOUNTER — Encounter: Payer: 59 | Admitting: Family Medicine

## 2020-10-29 LAB — LIPID PANEL
Chol/HDL Ratio: 2.4 ratio (ref 0.0–4.4)
Cholesterol, Total: 177 mg/dL (ref 100–199)
HDL: 75 mg/dL (ref >39)
LDL Chol Calc (NIH): 75 mg/dL (ref 0–99)
Triglycerides: 163 mg/dL — ABNORMAL HIGH (ref 0–149)
VLDL Cholesterol Cal: 27 mg/dL (ref 5–40)

## 2020-11-06 ENCOUNTER — Telehealth: Payer: Self-pay

## 2020-11-06 NOTE — Telephone Encounter (Signed)
Left patient a voice mail to call back to schedule a 1 year follow up after a voice mail we received to schedule.

## 2020-11-17 ENCOUNTER — Other Ambulatory Visit (HOSPITAL_COMMUNITY): Payer: Self-pay

## 2020-11-30 ENCOUNTER — Other Ambulatory Visit: Payer: 59

## 2020-11-30 ENCOUNTER — Other Ambulatory Visit: Payer: Self-pay

## 2020-11-30 DIAGNOSIS — B20 Human immunodeficiency virus [HIV] disease: Secondary | ICD-10-CM

## 2020-12-02 LAB — CBC
HCT: 39.8 % (ref 35.0–45.0)
Hemoglobin: 13.5 g/dL (ref 11.7–15.5)
MCH: 31.9 pg (ref 27.0–33.0)
MCHC: 33.9 g/dL (ref 32.0–36.0)
MCV: 94.1 fL (ref 80.0–100.0)
MPV: 9.7 fL (ref 7.5–12.5)
Platelets: 326 10*3/uL (ref 140–400)
RBC: 4.23 10*6/uL (ref 3.80–5.10)
RDW: 13 % (ref 11.0–15.0)
WBC: 6.9 10*3/uL (ref 3.8–10.8)

## 2020-12-02 LAB — COMPREHENSIVE METABOLIC PANEL
AG Ratio: 1.7 (calc) (ref 1.0–2.5)
ALT: 26 U/L (ref 6–29)
AST: 18 U/L (ref 10–35)
Albumin: 4.3 g/dL (ref 3.6–5.1)
Alkaline phosphatase (APISO): 71 U/L (ref 37–153)
BUN: 16 mg/dL (ref 7–25)
CO2: 27 mmol/L (ref 20–32)
Calcium: 9.5 mg/dL (ref 8.6–10.4)
Chloride: 102 mmol/L (ref 98–110)
Creat: 0.97 mg/dL (ref 0.50–1.05)
Globulin: 2.6 g/dL (calc) (ref 1.9–3.7)
Glucose, Bld: 93 mg/dL (ref 65–99)
Potassium: 4.7 mmol/L (ref 3.5–5.3)
Sodium: 137 mmol/L (ref 135–146)
Total Bilirubin: 0.5 mg/dL (ref 0.2–1.2)
Total Protein: 6.9 g/dL (ref 6.1–8.1)

## 2020-12-02 LAB — LIPID PANEL
Cholesterol: 183 mg/dL (ref <200)
HDL: 72 mg/dL (ref >=50)
LDL Cholesterol (Calc): 78 mg/dL (calc)
Non-HDL Cholesterol (Calc): 111 mg/dL (calc) (ref <130)
Total CHOL/HDL Ratio: 2.5 (calc) (ref <5.0)
Triglycerides: 235 mg/dL — ABNORMAL HIGH (ref <150)

## 2020-12-02 LAB — HELPER T-LYMPH-CD4 (ARMC ONLY)
% CD 4 Pos. Lymph.: 13.4 % — ABNORMAL LOW (ref 30.8–58.5)
Absolute CD 4 Helper: 335 /uL — ABNORMAL LOW (ref 359–1519)
Basophils Absolute: 0 10*3/uL (ref 0.0–0.2)
Basos: 1 %
EOS (ABSOLUTE): 0 10*3/uL (ref 0.0–0.4)
Eos: 1 %
Hematocrit: 40.6 % (ref 34.0–46.6)
Hemoglobin: 13.4 g/dL (ref 11.1–15.9)
Immature Grans (Abs): 0 10*3/uL (ref 0.0–0.1)
Immature Granulocytes: 0 %
Lymphocytes Absolute: 2.5 10*3/uL (ref 0.7–3.1)
Lymphs: 39 %
MCH: 31.8 pg (ref 26.6–33.0)
MCHC: 33 g/dL (ref 31.5–35.7)
MCV: 96 fL (ref 79–97)
Monocytes Absolute: 0.5 10*3/uL (ref 0.1–0.9)
Monocytes: 7 %
Neutrophils Absolute: 3.5 10*3/uL (ref 1.4–7.0)
Neutrophils: 52 %
Platelets: 336 10*3/uL (ref 150–450)
RBC: 4.22 x10E6/uL (ref 3.77–5.28)
RDW: 13 % (ref 11.7–15.4)
WBC: 6.5 10*3/uL (ref 3.4–10.8)

## 2020-12-02 LAB — HIV-1 RNA QUANT-NO REFLEX-BLD
HIV 1 RNA Quant: NOT DETECTED Copies/mL
HIV-1 RNA Quant, Log: NOT DETECTED Log cps/mL

## 2020-12-02 LAB — RPR: RPR Ser Ql: NONREACTIVE

## 2020-12-16 ENCOUNTER — Other Ambulatory Visit: Payer: Self-pay

## 2020-12-16 ENCOUNTER — Encounter: Payer: Self-pay | Admitting: Internal Medicine

## 2020-12-16 ENCOUNTER — Other Ambulatory Visit (HOSPITAL_COMMUNITY): Payer: Self-pay

## 2020-12-16 ENCOUNTER — Ambulatory Visit (INDEPENDENT_AMBULATORY_CARE_PROVIDER_SITE_OTHER): Payer: 59 | Admitting: Internal Medicine

## 2020-12-16 DIAGNOSIS — J302 Other seasonal allergic rhinitis: Secondary | ICD-10-CM | POA: Diagnosis not present

## 2020-12-16 DIAGNOSIS — F330 Major depressive disorder, recurrent, mild: Secondary | ICD-10-CM | POA: Diagnosis not present

## 2020-12-16 DIAGNOSIS — B20 Human immunodeficiency virus [HIV] disease: Secondary | ICD-10-CM | POA: Diagnosis not present

## 2020-12-16 MED ORDER — PREZCOBIX 800-150 MG PO TABS
1.0000 | ORAL_TABLET | Freq: Every day | ORAL | 11 refills | Status: DC
Start: 2020-12-16 — End: 2020-12-23
  Filled 2020-12-16: qty 30, 30d supply, fill #0

## 2020-12-16 MED ORDER — DESCOVY 200-25 MG PO TABS
1.0000 | ORAL_TABLET | Freq: Every day | ORAL | 11 refills | Status: DC
  Filled 2020-12-16: qty 30, 30d supply, fill #0

## 2020-12-16 NOTE — Progress Notes (Signed)
Patient Active Problem List   Diagnosis Date Noted   Human immunodeficiency virus (HIV) disease (HCC) 12/31/2005    Priority: 1.   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   CETIRIZINE (ZYRTEC) 10 MG TABLET    Take 10 mg by mouth daily.   CHOLECALCIFEROL (VITAMIN D) 50 MCG (2000 UT) TABLET    Take 2,000 Units by mouth daily.   LISINOPRIL (ZESTRIL) 20 MG TABLET    Take 1 tablet (20 mg total) by mouth daily.   MULTIPLE VITAMINS-MINERALS (MULTIVITAMIN WITH MINERALS) TABLET    Take 1 tablet by mouth daily.   SODIUM CHLORIDE (OCEAN) 0.65 % SOLN NASAL SPRAY    Place 1 spray into both nostrils as needed for congestion.   SPECIALTY VITAMINS PRODUCTS (MENOPAUSE SUPPORT PO)    Take 1 tablet by mouth daily.  Modified Medications   Modified Medication Previous Medication   DARUNAVIR-COBICISTAT (PREZCOBIX) 800-150 MG TABLET darunavir-cobicistat (PREZCOBIX) 800-150 MG tablet      Take 1 tablet by mouth daily with breakfast. Swallow whole. Do NOT crush, break or chew tablets. Take with food.    Take 1 tablet by mouth daily with breakfast. Swallow whole. Do NOT crush, break or chew tablets. Take with food.   EMTRICITABINE-TENOFOVIR AF (DESCOVY) 200-25 MG TABLET emtricitabine-tenofovir AF (DESCOVY) 200-25 MG tablet      Take 1 tablet by mouth daily.    Take 1 tablet by mouth daily.  Discontinued Medications   No medications on file    Subjective: Tammy Henson is in for her routine HIV follow-up visit.  She tells me that she has not had  any problems obtaining, taking or tolerating her Descovy or Prezcobix.  She recalls missing only 1 dose.  He tells me that the rent on her apartment recently went up and she agrees fused to pay the increase.  She is now living with one of her daughters which has been somewhat stressful for her.  She says that she has had mild depression.  She is not getting any regular exercise.  Her seasonal allergies have actually been a little better since moving.  She says that she noticed that her old apartment was quite dusty.  She had her annual influenza vaccine recently.  She is still refusing to take a COVID-vaccine.  Review of Systems: Review of Systems  Constitutional:  Negative for fever and weight loss.  HENT:  Positive for congestion.   Respiratory:  Negative for cough and shortness of breath.   Cardiovascular:  Negative for chest pain.  Psychiatric/Behavioral:  Positive for depression. The patient is not nervous/anxious.    Past Medical History:  Diagnosis Date   Allergic rhinitis 12/31/2005   Qualifier: Diagnosis of  By: Lennox Laity RN, Denise     Human immunodeficiency virus (HIV) disease (HCC) 12/31/2005   Qualifier: Diagnosis of  By: Lennox Laity RN, Angelique Blonder     HYPERLIPIDEMIA 12/31/2005   Qualifier: Diagnosis of  By: Ileene Musa     HYPERTENSION 12/31/2005   Qualifier: Diagnosis  of  By: Lennox Laity RN, Denise     Vitamin D deficiency 2010    Social History   Tobacco Use   Smoking status: Former   Smokeless tobacco: Never   Tobacco comments:    smoked for 5 years, quit age [age]  Vaping Use   Vaping Use: Never used  Substance Use Topics   Alcohol use: No    Alcohol/week: 0.0 standard drinks   Drug use: No    Family History  Problem Relation Age of Onset   Hypertension Mother    Depression Mother    Heart disease Mother        CHF   Heart disease Father        first MI in his 41's   Epilepsy Brother    HIV Brother    Hypertension Brother    Hypercholesterolemia Brother     HIV Daughter    Diabetes Neg Hx    Cancer Neg Hx     No Known Allergies  Health Maintenance  Topic Date Due   COVID-19 Vaccine (1) Never done   Zoster Vaccines- Shingrix (1 of 2) Never done   COLONOSCOPY (Pts 45-34yrs Insurance coverage will need to be confirmed)  10/31/2018   MAMMOGRAM  01/13/2019   PAP SMEAR-Modifier  05/06/2021   Pneumococcal Vaccine 47-48 Years old (4 - PPSV23 or PCV20) 10/28/2025   TETANUS/TDAP  06/16/2029   INFLUENZA VACCINE  Completed   Hepatitis C Screening  Completed   HIV Screening  Completed   HPV VACCINES  Aged Out    Objective:  There were no vitals filed for this visit. There is no height or weight on file to calculate BMI.  Physical Exam Constitutional:      Comments: She is very pleasant as usual.  Cardiovascular:     Rate and Rhythm: Normal rate and regular rhythm.     Heart sounds: No murmur heard. Pulmonary:     Effort: Pulmonary effort is normal.     Breath sounds: Normal breath sounds.  Skin:    Findings: No rash.  Psychiatric:        Mood and Affect: Mood normal.    Lab Results Lab Results  Component Value Date   WBC 6.9 11/30/2020   HGB 13.5 11/30/2020   HCT 39.8 11/30/2020   MCV 94.1 11/30/2020   PLT 326 11/30/2020    Lab Results  Component Value Date   CREATININE 0.97 11/30/2020   BUN 16 11/30/2020   NA 137 11/30/2020   K 4.7 11/30/2020   CL 102 11/30/2020   CO2 27 11/30/2020    Lab Results  Component Value Date   ALT 26 11/30/2020   AST 18 11/30/2020   ALKPHOS 75 06/06/2016   BILITOT 0.5 11/30/2020    Lab Results  Component Value Date   CHOL 183 11/30/2020   HDL 72 11/30/2020   LDLCALC 78 11/30/2020   TRIG 235 (H) 11/30/2020   CHOLHDL 2.5 11/30/2020   Lab Results  Component Value Date   LABRPR NON-REACTIVE 11/30/2020   HIV 1 RNA Quant  Date Value  11/30/2020 Not Detected Copies/mL  12/03/2019 <20 Copies/mL  11/26/2018 36 copies/mL (H)   CD4 T Cell Abs (/uL)  Date Value  12/03/2019 311 (L)   11/26/2018 234 (L)  04/20/2018 580     Problem List Items Addressed This Visit       1.   Human immunodeficiency virus (HIV) disease (HCC)    Her infection remains under excellent, long-term  control.  She will continue her current antiretroviral regimen and follow-up after lab work in 1 year.  I encouraged her to take the new bivalent COVID vaccine.      Relevant Medications   darunavir-cobicistat (PREZCOBIX) 800-150 MG tablet   emtricitabine-tenofovir AF (DESCOVY) 200-25 MG tablet   Other Relevant Orders   CBC   T-helper cell (CD4)- ([facility] clinic only)   Comprehensive metabolic panel   Lipid panel   RPR   HIV-1 RNA quant-no reflex-bld     Unprioritized   Depression    Mild, chronic depression has worsened slightly due to her recent move.  She sees that as a short-term solution to her housing situation.  I encouraged her to start getting regular exercise again.      Seasonal allergies    She notes improvement in her seasonal allergy symptoms after moving out of her old apartment.         Cliffton Asters, MD [facility] for Infectious Disease [facility] [phone] pager   [phone] cell 12/16/2020, 11:56 AM

## 2020-12-16 NOTE — Assessment & Plan Note (Signed)
Mild, chronic depression has worsened slightly due to her recent move.  She sees that as a short-term solution to her housing situation.  I encouraged her to start getting regular exercise again.

## 2020-12-16 NOTE — Assessment & Plan Note (Signed)
Her infection remains under excellent, long-term control.  She will continue her current antiretroviral regimen and follow-up after lab work in 1 year.  I encouraged her to take the new bivalent COVID vaccine.

## 2020-12-16 NOTE — Assessment & Plan Note (Signed)
She notes improvement in her seasonal allergy symptoms after moving out of her old apartment.

## 2020-12-17 ENCOUNTER — Other Ambulatory Visit: Payer: Self-pay | Admitting: Internal Medicine

## 2020-12-17 ENCOUNTER — Other Ambulatory Visit (HOSPITAL_COMMUNITY): Payer: Self-pay

## 2020-12-17 DIAGNOSIS — J301 Allergic rhinitis due to pollen: Secondary | ICD-10-CM

## 2020-12-17 MED ORDER — BECLOMETHASONE DIPROP MONOHYD 42 MCG/SPRAY NA SUSP
2.0000 | Freq: Two times a day (BID) | NASAL | 12 refills | Status: AC
  Filled 2020-12-17: qty 25, 30d supply, fill #0

## 2020-12-18 ENCOUNTER — Other Ambulatory Visit (HOSPITAL_COMMUNITY): Payer: Self-pay

## 2020-12-23 ENCOUNTER — Other Ambulatory Visit: Payer: Self-pay | Admitting: Internal Medicine

## 2020-12-23 DIAGNOSIS — B20 Human immunodeficiency virus [HIV] disease: Secondary | ICD-10-CM

## 2020-12-23 MED ORDER — DESCOVY 200-25 MG PO TABS: 1.0000 | ORAL_TABLET | Freq: Every day | ORAL | 11 refills | Status: DC

## 2020-12-25 ENCOUNTER — Other Ambulatory Visit (HOSPITAL_COMMUNITY): Payer: Self-pay

## 2021-01-29 ENCOUNTER — Telehealth: Payer: Self-pay

## 2021-01-29 ENCOUNTER — Other Ambulatory Visit (HOSPITAL_COMMUNITY): Payer: Self-pay

## 2021-01-29 NOTE — Telephone Encounter (Signed)
[facility] Patient Advocate Encounter  I was able to get a copay card for Prezcobix   I was able to get a copay card for Descovy          Clearance Coots, CPhT Specialty Pharmacy Patient [facility] for Infectious Disease Phone: [phone] Fax:  [phone]

## 2021-01-29 NOTE — Telephone Encounter (Signed)
Patient called requesting medication assistance as her copay is not/was not covered. Requesting a call back for help in getting it for descovy and prezcobix.  Forwarding to pharmacy team.  Rosanna Randy, RN

## 2021-02-24 ENCOUNTER — Other Ambulatory Visit: Payer: Self-pay | Admitting: Internal Medicine

## 2021-02-24 DIAGNOSIS — B20 Human immunodeficiency virus [HIV] disease: Secondary | ICD-10-CM

## 2021-02-25 MED ORDER — PREZCOBIX 800-150 MG PO TABS: 1.0000 | ORAL_TABLET | Freq: Every day | ORAL | 9 refills | Status: DC

## 2021-02-25 MED ORDER — DESCOVY 200-25 MG PO TABS: 1.0000 | ORAL_TABLET | Freq: Every day | ORAL | 9 refills | Status: DC

## 2021-02-25 NOTE — Telephone Encounter (Signed)
Patient request medications to be sent to [facility] location. Rx sent as requested. Tammy Henson

## 2021-02-25 NOTE — Addendum Note (Signed)
Addended by: Valarie Cones on: 02/25/2021 04:47 PM   Modules accepted: Orders

## 2021-03-03 ENCOUNTER — Other Ambulatory Visit (HOSPITAL_COMMUNITY): Payer: Self-pay

## 2021-10-27 ENCOUNTER — Other Ambulatory Visit: Payer: Self-pay | Admitting: Family Medicine

## 2021-10-27 DIAGNOSIS — I10 Essential (primary) hypertension: Secondary | ICD-10-CM

## 2021-11-11 ENCOUNTER — Encounter: Payer: 59 | Admitting: Family Medicine

## 2021-12-13 ENCOUNTER — Other Ambulatory Visit: Payer: Self-pay

## 2021-12-13 ENCOUNTER — Other Ambulatory Visit: Payer: 59

## 2021-12-13 DIAGNOSIS — B20 Human immunodeficiency virus [HIV] disease: Secondary | ICD-10-CM

## 2021-12-14 LAB — T-HELPER CELL (CD4) - (RCID CLINIC ONLY)
CD4 % Helper T Cell: 16 % — ABNORMAL LOW (ref 33–65)
CD4 T Cell Abs: 310 /uL — ABNORMAL LOW (ref 400–1790)

## 2021-12-16 LAB — COMPREHENSIVE METABOLIC PANEL
AG Ratio: 1.7 (calc) (ref 1.0–2.5)
ALT: 33 U/L — ABNORMAL HIGH (ref 6–29)
AST: 20 U/L (ref 10–35)
Albumin: 4.3 g/dL (ref 3.6–5.1)
Alkaline phosphatase (APISO): 78 U/L (ref 37–153)
BUN: 16 mg/dL (ref 7–25)
CO2: 26 mmol/L (ref 20–32)
Calcium: 9.3 mg/dL (ref 8.6–10.4)
Chloride: 99 mmol/L (ref 98–110)
Creat: 0.85 mg/dL (ref 0.50–1.05)
Globulin: 2.6 g/dL (calc) (ref 1.9–3.7)
Glucose, Bld: 218 mg/dL — ABNORMAL HIGH (ref 65–99)
Potassium: 4.1 mmol/L (ref 3.5–5.3)
Sodium: 134 mmol/L — ABNORMAL LOW (ref 135–146)
Total Bilirubin: 0.4 mg/dL (ref 0.2–1.2)
Total Protein: 6.9 g/dL (ref 6.1–8.1)

## 2021-12-16 LAB — CBC
HCT: 39 % (ref 35.0–45.0)
Hemoglobin: 13.3 g/dL (ref 11.7–15.5)
MCH: 31.2 pg (ref 27.0–33.0)
MCHC: 34.1 g/dL (ref 32.0–36.0)
MCV: 91.5 fL (ref 80.0–100.0)
MPV: 9.7 fL (ref 7.5–12.5)
Platelets: 317 10*3/uL (ref 140–400)
RBC: 4.26 10*6/uL (ref 3.80–5.10)
RDW: 12.3 % (ref 11.0–15.0)
WBC: 4.9 10*3/uL (ref 3.8–10.8)

## 2021-12-16 LAB — HIV-1 RNA QUANT-NO REFLEX-BLD
HIV 1 RNA Quant: NOT DETECTED Copies/mL
HIV-1 RNA Quant, Log: NOT DETECTED Log cps/mL

## 2021-12-16 LAB — LIPID PANEL
Cholesterol: 189 mg/dL (ref <200)
HDL: 49 mg/dL — ABNORMAL LOW (ref >=50)
Non-HDL Cholesterol (Calc): 140 mg/dL (calc) — ABNORMAL HIGH (ref <130)
Total CHOL/HDL Ratio: 3.9 (calc) (ref <5.0)
Triglycerides: 520 mg/dL — ABNORMAL HIGH (ref <150)

## 2021-12-16 LAB — RPR: RPR Ser Ql: NONREACTIVE

## 2021-12-29 ENCOUNTER — Other Ambulatory Visit (HOSPITAL_COMMUNITY): Payer: Self-pay

## 2021-12-29 ENCOUNTER — Telehealth: Payer: Self-pay | Admitting: Pharmacist

## 2021-12-29 ENCOUNTER — Ambulatory Visit (INDEPENDENT_AMBULATORY_CARE_PROVIDER_SITE_OTHER): Payer: 59 | Admitting: Internal Medicine

## 2021-12-29 ENCOUNTER — Other Ambulatory Visit: Payer: Self-pay

## 2021-12-29 ENCOUNTER — Encounter: Payer: Self-pay | Admitting: Internal Medicine

## 2021-12-29 VITALS — BP 111/75 | HR 79 | Temp 98.1°F | Ht 63.0 in | Wt 143.0 lb

## 2021-12-29 DIAGNOSIS — Z23 Encounter for immunization: Secondary | ICD-10-CM | POA: Diagnosis not present

## 2021-12-29 DIAGNOSIS — B20 Human immunodeficiency virus [HIV] disease: Secondary | ICD-10-CM

## 2021-12-29 MED ORDER — DARUN-COBIC-EMTRICIT-TENOFAF 800-150-200-10 MG PO TABS
1.0000 | ORAL_TABLET | Freq: Every day | ORAL | 11 refills | Status: DC
  Filled 2021-12-29 – 2022-01-06 (×2): qty 30, 30d supply, fill #0
  Filled 2022-02-09 (×2): qty 30, 30d supply, fill #1
  Filled 2022-03-10: qty 30, 30d supply, fill #2
  Filled 2022-04-15: qty 30, 30d supply, fill #3
  Filled 2022-06-02: qty 30, 30d supply, fill #4
  Filled 2022-06-21: qty 30, 30d supply, fill #5
  Filled 2022-07-22: qty 30, 30d supply, fill #6
  Filled 2022-08-17: qty 30, 30d supply, fill #7
  Filled 2022-09-21: qty 30, 30d supply, fill #8
  Filled 2022-10-28: qty 30, 30d supply, fill #9
  Filled 2022-11-15: qty 30, 30d supply, fill #10
  Filled 2022-12-15: qty 30, 30d supply, fill #11

## 2021-12-29 NOTE — Assessment & Plan Note (Signed)
Her infection remains under excellent, long-term control.  I will consolidate her antiretroviral therapy to [place].  We will help her review options for alternative pharmacies.  She received her annual influenza vaccine here today.  She will follow-up after lab work in 1 year.

## 2021-12-29 NOTE — Telephone Encounter (Signed)
Patient would like to switch pharmacies given ongoing issues with [facility]. States they reach out at the last minute when she is already down to 1-2 pills and usually only have one of her medicines in stock. Dr. Megan Salon plans to consolidate her regimen to [facility] which will help with this.  She states she is interested in mailing through [facility] and receiving 90 day supplies. Darden Dates will be working on [employer] first and then will fill through [facility]. Unfortunately with [employer] she cannot fill 90 day supplies. She recently picked up a one month supply.  Alfonse Spruce, PharmD, CPP, BCIDP, [place] Clinical Pharmacist Practitioner Infectious [place] for Infectious Disease

## 2021-12-29 NOTE — Progress Notes (Signed)
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   BETA CAROTENE (VITAMIN A) 25000 UNIT CAPSULE    Take 25,000 Units by mouth daily.   CETIRIZINE (ZYRTEC) 10 MG TABLET    Take 10 mg by mouth daily.   CHOLECALCIFEROL (VITAMIN D) 50 MCG (2000 UT) TABLET    Take 2,000 Units by mouth daily.   LISINOPRIL (ZESTRIL) 20 MG TABLET    TAKE 1 TABLET BY MOUTH EVERY DAY   MULTIPLE VITAMINS-MINERALS (MULTIVITAMIN WITH MINERALS) TABLET    Take 1 tablet by mouth daily.   OMEGA-3 FATTY ACIDS (FISH OIL PO)    Take by mouth daily.   SODIUM CHLORIDE (OCEAN) 0.65 % SOLN NASAL SPRAY    Place 1 spray into both nostrils as needed for congestion.   SPECIALTY VITAMINS PRODUCTS (MENOPAUSE SUPPORT PO)    Take 1 tablet by mouth daily.  Modified Medications   No medications on file  Discontinued Medications   DARUNAVIR-COBICISTAT (PREZCOBIX) 800-150 MG TABLET    Take 1 tablet by mouth daily with breakfast. Swallow whole. Do NOT crush, break or chew tablets. Take with food.   EMTRICITABINE-TENOFOVIR AF (DESCOVY) 200-25 MG TABLET    Take 1 tablet by mouth daily.    Subjective: Tammy Henson is in for her routine HIV follow-up visit.  She denies any problems taking or tolerating her Descovy or Prezcobix.  Her pharmacy in [place] has been late getting refills recently causing her to miss a few days of  treatment.  She is feeling well other than recent sinus congestion that she thinks is due to her allergies.  She is not taking anything for them now.  She wants to get a new PCP in [place] where she lives.  She has not had her annual flu shot yet.  Review of Systems: Review of Systems  Constitutional:  Negative for fever and weight loss.  HENT:  Positive for congestion. Negative for sinus pain.   Respiratory:  Negative for cough.   Cardiovascular:  Negative for chest pain.    Past Medical History:  Diagnosis Date   Allergic rhinitis 12/31/2005   Qualifier: Diagnosis of  By: Lennox Laity RN, Denise     Human immunodeficiency virus (HIV) disease (HCC) 12/31/2005   Qualifier: Diagnosis of  By: Lennox Laity RN, Angelique Blonder     HYPERLIPIDEMIA 12/31/2005   Qualifier: Diagnosis of  By: Lennox Laity RN, Angelique Blonder     HYPERTENSION 12/31/2005   Qualifier: Diagnosis of  By: Lennox Laity RN, Denise     Vitamin D deficiency 2010    Social History   Tobacco Use   Smoking status: Former   Smokeless tobacco: Never   Tobacco comments:    smoked for 5 years, quit age [age]  Vaping Use   Vaping Use: Never used  Substance Use Topics   Alcohol use: No    Alcohol/week: 0.0 standard  drinks of alcohol   Drug use: No    Family History  Problem Relation Age of Onset   Hypertension Mother    Depression Mother    Heart disease Mother        CHF   Heart disease Father        first MI in his 73's   Epilepsy Brother    HIV Brother    Hypertension Brother    Hypercholesterolemia Brother    HIV Daughter    Diabetes Neg Hx    Cancer Neg Hx     No Known Allergies  Health Maintenance  Topic Date Due   COVID-19 Vaccine (1) Never done   Zoster Vaccines- Shingrix (1 of 2) Never done   COLONOSCOPY (Pts 45-30yrs Insurance coverage will need to be confirmed)  10/31/2018   MAMMOGRAM  01/13/2019   PAP SMEAR-Modifier  05/06/2021   INFLUENZA VACCINE  09/28/2021   TETANUS/TDAP  06/16/2029   Hepatitis C Screening  Completed    HIV Screening  Completed   HPV VACCINES  Aged Out    Objective:  Vitals:   12/29/21 0847  BP: 111/75  Pulse: 79  Temp: 98.1 F (36.7 C)  TempSrc: Oral  SpO2: 97%  Weight: 143 lb (64.9 kg)  Height: 5\' 3"  (1.6 m)   Body mass index is 25.33 kg/m.  Physical Exam Constitutional:      Comments: She sounds congested but otherwise is in good spirits.  Cardiovascular:     Rate and Rhythm: Normal rate and regular rhythm.     Heart sounds: No murmur heard. Pulmonary:     Effort: Pulmonary effort is normal.     Breath sounds: Normal breath sounds.  Psychiatric:        Mood and Affect: Mood normal.     Lab Results Lab Results  Component Value Date   WBC 4.9 12/13/2021   HGB 13.3 12/13/2021   HCT 39.0 12/13/2021   MCV 91.5 12/13/2021   PLT 317 12/13/2021    Lab Results  Component Value Date   CREATININE 0.85 12/13/2021   BUN 16 12/13/2021   NA 134 (L) 12/13/2021   K 4.1 12/13/2021   CL 99 12/13/2021   CO2 26 12/13/2021    Lab Results  Component Value Date   ALT 33 (H) 12/13/2021   AST 20 12/13/2021   ALKPHOS 75 06/06/2016   BILITOT 0.4 12/13/2021    Lab Results  Component Value Date   CHOL 189 12/13/2021   HDL 49 (L) 12/13/2021   LDLCALC  12/13/2021     Comment:     . LDL cholesterol not calculated. Triglyceride levels greater than 400 mg/dL invalidate calculated LDL results. . Reference range: <100 . Desirable range <100 mg/dL for primary prevention;   <70 mg/dL for patients with CHD or diabetic patients  with > or = 2 CHD risk factors. 12/15/2021 LDL-C is now calculated using the Martin-Hopkins  calculation, which is a validated novel method providing  better accuracy than the Friedewald equation in the  estimation of LDL-C.  Marland Kitchen et al. Horald Pollen. Lenox Ahr): 2061-2068  (http://education.[employer].com/faq/FAQ164)    TRIG 520 (H) 12/13/2021   CHOLHDL 3.9 12/13/2021   Lab Results  Component Value Date   LABRPR NON-REACTIVE 12/13/2021    HIV 1 RNA Quant (Copies/mL)  Date Value  12/13/2021 Not Detected  11/30/2020 Not Detected  12/03/2019 <20   CD4 T Cell Abs (/uL)  Date Value  12/13/2021 310 (L)  12/03/2019 311 (L)  11/26/2018 234 (L)     Problem List Items Addressed This Visit       High   Human immunodeficiency virus (HIV) disease ([place])    Her infection remains under excellent, long-term control.  I will consolidate her antiretroviral therapy to [place].  We will help her review options for alternative pharmacies.  She received her annual influenza vaccine here today.  She will follow-up after lab work in 1 year.         Michel Bickers, MD [facility] for Infectious [place] [phone] pager   [phone] cell 12/29/2021, 9:07 AM

## 2021-12-30 ENCOUNTER — Telehealth: Payer: Self-pay

## 2021-12-30 ENCOUNTER — Other Ambulatory Visit (HOSPITAL_COMMUNITY): Payer: Self-pay

## 2021-12-30 ENCOUNTER — Telehealth: Payer: Self-pay | Admitting: *Deleted

## 2021-12-30 NOTE — Telephone Encounter (Signed)
[facility] Patient Advocate Encounter   Received notification from [employer] that prior authorization for Symtuza is required.   PA submitted on 12/30/21 Key [id number] Status is pending    [facility] will continue to follow.   Tammy Henson, [place] Specialty Pharmacy Patient [facility] for Infectious Disease Phone: [phone] Fax:  [phone]

## 2021-12-30 NOTE — Telephone Encounter (Signed)
Called patient to confirm appt for Monday afternoon. She asked to cancel. I asked if she wanted to reschedule. She said she found another doctor. Just an FYI.

## 2022-01-03 ENCOUNTER — Encounter: Payer: 59 | Admitting: Family Medicine

## 2022-01-04 ENCOUNTER — Other Ambulatory Visit (HOSPITAL_COMMUNITY): Payer: Self-pay

## 2022-01-06 ENCOUNTER — Other Ambulatory Visit (HOSPITAL_COMMUNITY): Payer: Self-pay

## 2022-01-13 ENCOUNTER — Other Ambulatory Visit (HOSPITAL_COMMUNITY): Payer: Self-pay

## 2022-01-19 ENCOUNTER — Other Ambulatory Visit: Payer: Self-pay | Admitting: Internal Medicine

## 2022-01-19 DIAGNOSIS — B20 Human immunodeficiency virus [HIV] disease: Secondary | ICD-10-CM

## 2022-01-26 ENCOUNTER — Other Ambulatory Visit (HOSPITAL_COMMUNITY): Payer: Self-pay

## 2022-01-28 ENCOUNTER — Other Ambulatory Visit (HOSPITAL_COMMUNITY): Payer: Self-pay

## 2022-01-30 ENCOUNTER — Other Ambulatory Visit: Payer: Self-pay | Admitting: Family Medicine

## 2022-01-30 DIAGNOSIS — I10 Essential (primary) hypertension: Secondary | ICD-10-CM

## 2022-01-31 ENCOUNTER — Other Ambulatory Visit (HOSPITAL_COMMUNITY): Payer: Self-pay

## 2022-02-09 ENCOUNTER — Other Ambulatory Visit (HOSPITAL_COMMUNITY): Payer: Self-pay

## 2022-02-09 ENCOUNTER — Other Ambulatory Visit: Payer: Self-pay

## 2022-02-24 ENCOUNTER — Other Ambulatory Visit (HOSPITAL_COMMUNITY): Payer: Self-pay

## 2022-03-01 ENCOUNTER — Other Ambulatory Visit (HOSPITAL_COMMUNITY): Payer: Self-pay

## 2022-03-03 ENCOUNTER — Other Ambulatory Visit (HOSPITAL_COMMUNITY): Payer: Self-pay

## 2022-03-10 ENCOUNTER — Other Ambulatory Visit (HOSPITAL_COMMUNITY): Payer: Self-pay

## 2022-03-10 ENCOUNTER — Other Ambulatory Visit: Payer: Self-pay

## 2022-03-10 ENCOUNTER — Other Ambulatory Visit: Payer: Self-pay | Admitting: Family Medicine

## 2022-03-10 DIAGNOSIS — Z1231 Encounter for screening mammogram for malignant neoplasm of breast: Secondary | ICD-10-CM

## 2022-03-14 ENCOUNTER — Ambulatory Visit: Payer: 59 | Admitting: Infectious Diseases

## 2022-03-16 ENCOUNTER — Inpatient Hospital Stay: Admission: RE | Admit: 2022-03-16 | Payer: 59 | Source: Ambulatory Visit

## 2022-03-29 ENCOUNTER — Other Ambulatory Visit (HOSPITAL_COMMUNITY): Payer: Self-pay

## 2022-03-31 ENCOUNTER — Other Ambulatory Visit (HOSPITAL_COMMUNITY): Payer: Self-pay

## 2022-04-04 ENCOUNTER — Ambulatory Visit: Payer: 59 | Admitting: Infectious Diseases

## 2022-04-15 ENCOUNTER — Other Ambulatory Visit (HOSPITAL_COMMUNITY): Payer: Self-pay

## 2022-04-15 ENCOUNTER — Other Ambulatory Visit: Payer: Self-pay

## 2022-05-05 ENCOUNTER — Other Ambulatory Visit (HOSPITAL_COMMUNITY): Payer: Self-pay

## 2022-05-09 ENCOUNTER — Other Ambulatory Visit (HOSPITAL_COMMUNITY): Payer: Self-pay

## 2022-05-11 ENCOUNTER — Other Ambulatory Visit (HOSPITAL_COMMUNITY): Payer: Self-pay

## 2022-05-11 ENCOUNTER — Ambulatory Visit: Payer: 59

## 2022-06-02 ENCOUNTER — Other Ambulatory Visit (HOSPITAL_COMMUNITY): Payer: Self-pay

## 2022-06-16 ENCOUNTER — Other Ambulatory Visit: Payer: Self-pay

## 2022-06-21 ENCOUNTER — Other Ambulatory Visit: Payer: Self-pay

## 2022-06-27 ENCOUNTER — Other Ambulatory Visit: Payer: Self-pay

## 2022-07-19 ENCOUNTER — Other Ambulatory Visit (HOSPITAL_COMMUNITY): Payer: Self-pay

## 2022-07-22 ENCOUNTER — Other Ambulatory Visit (HOSPITAL_COMMUNITY): Payer: Self-pay

## 2022-08-17 ENCOUNTER — Other Ambulatory Visit (HOSPITAL_COMMUNITY): Payer: Self-pay

## 2022-08-19 ENCOUNTER — Other Ambulatory Visit (HOSPITAL_COMMUNITY): Payer: Self-pay

## 2022-09-16 LAB — HM PAP SMEAR: HPV, high-risk: NEGATIVE

## 2022-09-21 ENCOUNTER — Other Ambulatory Visit (HOSPITAL_COMMUNITY): Payer: Self-pay

## 2022-10-13 ENCOUNTER — Other Ambulatory Visit (HOSPITAL_COMMUNITY): Payer: Self-pay

## 2022-10-17 ENCOUNTER — Other Ambulatory Visit (HOSPITAL_COMMUNITY): Payer: Self-pay

## 2022-10-19 ENCOUNTER — Encounter (HOSPITAL_COMMUNITY): Payer: Self-pay

## 2022-10-19 ENCOUNTER — Other Ambulatory Visit (HOSPITAL_COMMUNITY): Payer: Self-pay

## 2022-10-28 ENCOUNTER — Other Ambulatory Visit (HOSPITAL_COMMUNITY): Payer: Self-pay

## 2022-10-28 ENCOUNTER — Other Ambulatory Visit: Payer: Self-pay

## 2022-10-29 ENCOUNTER — Other Ambulatory Visit (HOSPITAL_COMMUNITY): Payer: Self-pay

## 2022-11-15 ENCOUNTER — Other Ambulatory Visit (HOSPITAL_COMMUNITY): Payer: Self-pay

## 2022-11-16 ENCOUNTER — Ambulatory Visit
Admission: RE | Admit: 2022-11-16 | Discharge: 2022-11-16 | Disposition: A | Discharge status: Home / Self Care | Payer: 59 | Source: Ambulatory Visit | Attending: Family Medicine | Admitting: Family Medicine

## 2022-11-16 ENCOUNTER — Other Ambulatory Visit: Payer: Self-pay | Admitting: Family Medicine

## 2022-11-16 DIAGNOSIS — Z1231 Encounter for screening mammogram for malignant neoplasm of breast: Secondary | ICD-10-CM

## 2022-11-16 LAB — HM MAMMOGRAPHY

## 2022-12-07 ENCOUNTER — Other Ambulatory Visit (HOSPITAL_COMMUNITY): Payer: Self-pay

## 2022-12-08 ENCOUNTER — Other Ambulatory Visit: Payer: Self-pay

## 2022-12-08 ENCOUNTER — Other Ambulatory Visit (HOSPITAL_COMMUNITY): Payer: Self-pay

## 2022-12-08 ENCOUNTER — Telehealth: Payer: Self-pay

## 2022-12-08 ENCOUNTER — Other Ambulatory Visit: Payer: Self-pay | Admitting: Infectious Diseases

## 2022-12-08 DIAGNOSIS — B20 Human immunodeficiency virus [HIV] disease: Secondary | ICD-10-CM

## 2022-12-08 DIAGNOSIS — Z113 Encounter for screening for infections with a predominantly sexual mode of transmission: Secondary | ICD-10-CM

## 2022-12-08 NOTE — Telephone Encounter (Addendum)
Pharmacy Patient Advocate Encounter   Received notification from CoverMyMeds that prior authorization for Symtuza is required/requested.   Insurance verification completed.   The patient is insured through [facility] .   Per test claim: PA required; PA submitted to [facility] via CoverMyMeds Key/confirmation #/EOC [id number] Status is pending   CLINICAL QUESTIONS ANSWERED.  PA WAS DENIED WILL RESEND AFTER PROGRESS NOTES AND LABS ARE UPDATED ON 12/19/22

## 2022-12-09 ENCOUNTER — Other Ambulatory Visit: Payer: Self-pay

## 2022-12-09 ENCOUNTER — Other Ambulatory Visit: Payer: 59

## 2022-12-09 DIAGNOSIS — Z113 Encounter for screening for infections with a predominantly sexual mode of transmission: Secondary | ICD-10-CM

## 2022-12-09 DIAGNOSIS — B20 Human immunodeficiency virus [HIV] disease: Secondary | ICD-10-CM

## 2022-12-12 LAB — CBC WITH DIFFERENTIAL/PLATELET
Absolute Monocytes: 439 {cells}/uL (ref 200–950)
Basophils Absolute: 31 {cells}/uL (ref 0–200)
Basophils Relative: 0.6 %
Eosinophils Absolute: 41 {cells}/uL (ref 15–500)
Eosinophils Relative: 0.8 %
HCT: 39.9 % (ref 35.0–45.0)
Hemoglobin: 13.2 g/dL (ref 11.7–15.5)
Lymphs Abs: 2111 {cells}/uL (ref 850–3900)
MCH: 30.8 pg (ref 27.0–33.0)
MCHC: 33.1 g/dL (ref 32.0–36.0)
MCV: 93 fL (ref 80.0–100.0)
MPV: 9.9 fL (ref 7.5–12.5)
Monocytes Relative: 8.6 %
Neutro Abs: 2479 {cells}/uL (ref 1500–7800)
Neutrophils Relative %: 48.6 %
Platelets: 311 10*3/uL (ref 140–400)
RBC: 4.29 10*6/uL (ref 3.80–5.10)
RDW: 12.7 % (ref 11.0–15.0)
Total Lymphocyte: 41.4 %
WBC: 5.1 10*3/uL (ref 3.8–10.8)

## 2022-12-12 LAB — T-HELPER CELLS (CD4) COUNT (NOT AT ARMC)
Absolute CD4: 410 {cells}/uL — ABNORMAL LOW (ref 490–1740)
CD4 T Helper %: 16 % — ABNORMAL LOW (ref 30–61)
Total lymphocyte count: 2643 {cells}/uL (ref 850–3900)

## 2022-12-12 LAB — COMPLETE METABOLIC PANEL WITH GFR
AG Ratio: 1.3 (calc) (ref 1.0–2.5)
ALT: 11 U/L (ref 6–29)
AST: 11 U/L (ref 10–35)
Albumin: 4 g/dL (ref 3.6–5.1)
Alkaline phosphatase (APISO): 79 U/L (ref 37–153)
BUN: 14 mg/dL (ref 7–25)
CO2: 25 mmol/L (ref 20–32)
Calcium: 9.6 mg/dL (ref 8.6–10.4)
Chloride: 103 mmol/L (ref 98–110)
Creat: 0.76 mg/dL (ref 0.50–1.05)
Globulin: 3 g/dL (ref 1.9–3.7)
Glucose, Bld: 104 mg/dL — ABNORMAL HIGH (ref 65–99)
Potassium: 4.3 mmol/L (ref 3.5–5.3)
Sodium: 137 mmol/L (ref 135–146)
Total Bilirubin: 0.3 mg/dL (ref 0.2–1.2)
Total Protein: 7 g/dL (ref 6.1–8.1)
eGFR: 87 mL/min/{1.73_m2} (ref >=60)

## 2022-12-12 LAB — HIV-1 RNA QUANT-NO REFLEX-BLD
HIV 1 RNA Quant: NOT DETECTED {copies}/mL
HIV-1 RNA Quant, Log: NOT DETECTED {Log}

## 2022-12-12 LAB — LIPID PANEL
Cholesterol: 184 mg/dL (ref <200)
HDL: 56 mg/dL (ref >=50)
Non-HDL Cholesterol (Calc): 128 mg/dL (ref <130)
Total CHOL/HDL Ratio: 3.3 (calc) (ref <5.0)
Triglycerides: 493 mg/dL — ABNORMAL HIGH (ref <150)

## 2022-12-12 LAB — RPR: RPR Ser Ql: NONREACTIVE

## 2022-12-13 ENCOUNTER — Other Ambulatory Visit (HOSPITAL_COMMUNITY): Payer: Self-pay

## 2022-12-14 ENCOUNTER — Other Ambulatory Visit (HOSPITAL_COMMUNITY): Payer: Self-pay

## 2022-12-14 ENCOUNTER — Other Ambulatory Visit: Payer: Self-pay

## 2022-12-15 ENCOUNTER — Other Ambulatory Visit (HOSPITAL_COMMUNITY): Payer: Self-pay

## 2022-12-15 NOTE — Progress Notes (Signed)
Specialty Pharmacy Ongoing Clinical Assessment Note  Tammy Henson is a 62 y.o. female who is being followed by the specialty pharmacy service for RxSp HIV   Patient's specialty medication(s) reviewed today: Darun-Cobic-Emtricit-Tenofaf   Missed doses in the last 4 weeks: 0   Patient/Caregiver did not have any additional questions or concerns.   Therapeutic benefit summary: Patient is achieving benefit   Adverse events/side effects summary: No adverse events/side effects   Patient's therapy is appropriate to: Continue    Goals Addressed             This Visit's Progress    Achieve Undetectable HIV Viral Load < 20       Patient is on track. Patient will maintain adherence.  Last HIV RNA from 12/09/22 remained undetectable.         Follow up:  6 months  Servando Snare Specialty Pharmacist

## 2022-12-15 NOTE — Progress Notes (Signed)
Specialty Pharmacy Refill Coordination Note  Tammy Henson is a 62 y.o. female contacted today regarding refills of specialty medication(s) Darun-Cobic-Emtricit-Tenofaf   Patient requested Delivery   Delivery date: 12/21/22   Verified address: Patient address [address]  [place] [address]   Medication will be filled on 12/20/22.

## 2022-12-16 NOTE — Telephone Encounter (Signed)
Submitted appeal on  Tuesday for Symtuza; awaiting response.  Margarite Gouge, PharmD, CPP, BCIDP, AAHIVP Clinical Pharmacist Practitioner Infectious Diseases Clinical Pharmacist [facility] for Infectious Disease

## 2022-12-19 ENCOUNTER — Other Ambulatory Visit: Payer: Self-pay

## 2022-12-19 ENCOUNTER — Ambulatory Visit (INDEPENDENT_AMBULATORY_CARE_PROVIDER_SITE_OTHER): Payer: 59 | Admitting: Infectious Diseases

## 2022-12-19 ENCOUNTER — Other Ambulatory Visit (HOSPITAL_COMMUNITY): Payer: Self-pay

## 2022-12-19 ENCOUNTER — Encounter: Payer: Self-pay | Admitting: Infectious Diseases

## 2022-12-19 VITALS — BP 110/73 | HR 73 | Resp 16 | Ht 63.0 in | Wt 140.0 lb

## 2022-12-19 DIAGNOSIS — Z23 Encounter for immunization: Secondary | ICD-10-CM | POA: Diagnosis not present

## 2022-12-19 DIAGNOSIS — Z131 Encounter for screening for diabetes mellitus: Secondary | ICD-10-CM

## 2022-12-19 DIAGNOSIS — B20 Human immunodeficiency virus [HIV] disease: Secondary | ICD-10-CM

## 2022-12-19 DIAGNOSIS — E781 Pure hyperglyceridemia: Secondary | ICD-10-CM

## 2022-12-19 MED ORDER — DARUN-COBIC-EMTRICIT-TENOFAF 800-150-200-10 MG PO TABS
1.0000 | ORAL_TABLET | Freq: Every day | ORAL | 11 refills | Status: DC
  Filled 2022-12-19: qty 30, 30d supply, fill #0
  Filled 2023-01-24 – 2023-01-25 (×2): qty 30, 30d supply, fill #1
  Filled 2023-02-20: qty 30, 30d supply, fill #2
  Filled 2023-03-20: qty 30, 30d supply, fill #3
  Filled 2023-04-19: qty 30, 30d supply, fill #4
  Filled 2023-05-19: qty 30, 30d supply, fill #5
  Filled 2023-06-21: qty 30, 30d supply, fill #6
  Filled 2023-07-14: qty 30, 30d supply, fill #7
  Filled 2023-08-14 – 2023-08-16 (×2): qty 30, 30d supply, fill #8
  Filled 2023-09-12: qty 30, 30d supply, fill #9
  Filled 2023-10-06 – 2023-10-24 (×2): qty 30, 30d supply, fill #10

## 2022-12-19 NOTE — Progress Notes (Signed)
Name: Tammy Henson  DOB: 08-Feb-1961 MRN: [id number] PCP: Lezlie Lye, Meda Coffee, MD    Brief Narrative:  Tammy Henson is a 62 y.o. female with HIV diagnosed ~1995 CD4 nadir unknown VL unknown HIV Risk: sexual History of OIs: unknown Intake Labs : Hep B sAg (- 2008), sAb (-2008), cAb (); Hep A (), Hep C () Quantiferon () HLA B*5701 () G6PD: ()   Previous Regimens: Symtuza   Genotypes:   Subjective:   Chief Complaint  Patient presents with   Follow-up    B20 Tammy Henson pt     HPI: Tammy Henson is here for annual follow up as a new patient to me - she has been following with Dr. Orvan Henson for many years up until his retirment. She has continued on Symtuza once daily with food - received from [facility].  She has been feeling well. Works here in [employer], hopeful for retirement soon(ish) or at least the ability to drop to part time work and less financial demands.  Daughter is getting married in May.   Last pap smear was with [facility] - normal  Mammogram normal recently.  No sexual health concerns - not sexually active presently.       12/19/2022    9:00 AM  Depression screen PHQ 2/9  Decreased Interest 0  Down, Depressed, Hopeless 0  PHQ - 2 Score 0    Review of Systems  Constitutional:  Negative for chills and fever.  HENT:  Negative for tinnitus.   Eyes:  Negative for blurred vision and photophobia.  Respiratory:  Negative for cough and sputum production.   Cardiovascular:  Negative for chest pain.  Gastrointestinal:  Negative for diarrhea, nausea and vomiting.  Genitourinary:  Negative for dysuria.  Skin:  Negative for rash.  Neurological:  Negative for headaches.     Past Medical History:  Diagnosis Date   Allergic rhinitis 12/31/2005   Qualifier: Diagnosis of  By: Lennox Laity RN, Denise     Human immunodeficiency virus (HIV) disease (HCC) 12/31/2005   Qualifier: Diagnosis of  By: Lennox Laity RN, Angelique Blonder     HYPERLIPIDEMIA 12/31/2005   Qualifier:  Diagnosis of  By: Lennox Laity RN, Angelique Blonder     HYPERTENSION 12/31/2005   Qualifier: Diagnosis of  By: Lennox Laity RN, Denise     Vitamin D deficiency 2010    Outpatient Medications Prior to Visit  Medication Sig Dispense Refill   Beta Carotene (VITAMIN A) 25000 UNIT capsule Take 25,000 Units by mouth daily.     cetirizine (ZYRTEC) 10 MG tablet Take 10 mg by mouth daily.     Cholecalciferol (VITAMIN D) 50 MCG (2000 UT) tablet Take 2,000 Units by mouth daily.     lisinopril (ZESTRIL) 20 MG tablet TAKE 1 TABLET BY MOUTH EVERY DAY 90 tablet 0   Omega-3 Fatty Acids (FISH OIL PO) Take by mouth daily.     sodium chloride (OCEAN) 0.65 % SOLN nasal spray Place 1 spray into both nostrils as needed for congestion.     Specialty Vitamins Products (MENOPAUSE SUPPORT PO) Take 1 tablet by mouth daily.     Darunavir-Cobicistat-Emtricitabine-Tenofovir Alafenamide (SYMTUZA) 800-150-200-10 MG TABS Take 1 tablet by mouth daily with breakfast. 30 tablet 11   beclomethasone (BECONASE-AQ) 42 MCG/SPRAY nasal spray Place 2 sprays into each nostril 2 (two) times daily. (Patient not taking: Reported on 12/29/2021) 25 g 12   Multiple Vitamins-Minerals (MULTIVITAMIN WITH MINERALS) tablet Take 1 tablet by mouth daily. (Patient not taking: Reported on 12/29/2021)  No facility-administered medications prior to visit.     No Known Allergies  Social History   Tobacco Use   Smoking status: Former    Passive exposure: Never   Smokeless tobacco: Never   Tobacco comments:    smoked for 5 years, quit age [age]  Vaping Use   Vaping status: Never Used  Substance Use Topics   Alcohol use: No    Alcohol/week: 0.0 standard drinks of alcohol   Drug use: No    Family History  Problem Relation Age of Onset   Hypertension Mother    Depression Mother    Heart disease Mother        CHF   Heart disease Father        first MI in his 50's   HIV Daughter    Epilepsy Brother    HIV Brother    Hypertension Brother     Hypercholesterolemia Brother    Diabetes Neg Hx    Cancer Neg Hx    Breast cancer Neg Hx     Social History   Substance and Sexual Activity  Sexual Activity Not Currently   Partners: Male     Objective:   Vitals:   12/19/22 0901  BP: 110/73  Pulse: 73  Resp: 16  Weight: 140 lb (63.5 kg)  Height: 5\' 3"  (1.6 m)   Body mass index is 24.8 kg/m.  Physical Exam Vitals reviewed.  Constitutional:      Appearance: She is well-developed.  HENT:     Mouth/Throat:     Mouth: No oral lesions.     Dentition: Normal dentition. No dental abscesses.     Pharynx: No oropharyngeal exudate.  Cardiovascular:     Rate and Rhythm: Normal rate and regular rhythm.     Heart sounds: Normal heart sounds.  Pulmonary:     Effort: Pulmonary effort is normal.     Breath sounds: Normal breath sounds.  Abdominal:     General: There is no distension.     Palpations: Abdomen is soft.     Tenderness: There is no abdominal tenderness.  Lymphadenopathy:     Cervical: No cervical adenopathy.  Skin:    General: Skin is warm and dry.     Findings: No rash.  Neurological:     Mental Status: She is alert and oriented to person, place, and time.  Psychiatric:        Judgment: Judgment normal.     Lab Results Lab Results  Component Value Date   WBC 5.1 12/09/2022   HGB 13.2 12/09/2022   HCT 39.9 12/09/2022   MCV 93.0 12/09/2022   PLT 311 12/09/2022    Lab Results  Component Value Date   CREATININE 0.76 12/09/2022   BUN 14 12/09/2022   NA 137 12/09/2022   K 4.3 12/09/2022   CL 103 12/09/2022   CO2 25 12/09/2022    Lab Results  Component Value Date   ALT 11 12/09/2022   AST 11 12/09/2022   ALKPHOS 75 06/06/2016   BILITOT 0.3 12/09/2022    Lab Results  Component Value Date   CHOL 184 12/09/2022   HDL 56 12/09/2022   LDLCALC  12/09/2022     Comment:     . LDL cholesterol not calculated. Triglyceride levels greater than 400 mg/dL invalidate calculated LDL  results. . Reference range: <100 . Desirable range <100 mg/dL for primary prevention;   <70 mg/dL for patients with CHD or diabetic patients  with > or =  2 CHD risk factors. Marland Kitchen LDL-C is now calculated using the Martin-Hopkins  calculation, which is a validated novel method providing  better accuracy than the Friedewald equation in the  estimation of LDL-C.  Horald Pollen et al. Lenox Ahr. 4098;119(14): 2061-2068  (http://education.[facility].com/faq/FAQ164)    TRIG 493 (H) 12/09/2022   CHOLHDL 3.3 12/09/2022   HIV 1 RNA Quant (Copies/mL)  Date Value  12/09/2022 Not Detected  12/13/2021 Not Detected  11/30/2020 Not Detected   CD4 T Cell Abs (/uL)  Date Value  12/13/2021 310 (L)  12/03/2019 311 (L)  11/26/2018 234 (L)     Assessment & Plan:   Problem List Items Addressed This Visit       Unprioritized   Hypertriglyceridemia    With new research coming out by way of the REPRIEVE study regarding cardiovascular event risk reduction for PLWH, I discussed that over the 8 year trial initiation of statin (pitavastatin) therapy was shown to reduce CV disease by 35% independent of other risk factors.    The 10-year ASCVD risk score (Arnett DK, et al., 2019) is: 4.7%   Values used to calculate the score:     Age: 35 years     Sex: Female     Is Non-Hispanic African American: No     Diabetic: No     Tobacco smoker: No     Systolic Blood Pressure: 110 mmHg     Is BP treated: Yes     HDL Cholesterol: 56 mg/dL     Total Cholesterol: 184 mg/dL   We discussed the patient's 10-year CVD Risk Score to be at least moderately elevated, and would benefit from intervention given HIV+ and > 84 yo.   With overall risk <5% and no family history of CAD will work with nutrition changes alone for treatment.         Relevant Orders   Lipid panel   Human immunodeficiency virus (HIV) disease (HCC) - Primary    Very well controlled on once daily Symtuza. No concerns with access or adherence  to medication. They are tolerating the medication well without side effects. No drug interactions identified. Pertinent lab tests reviewed from 2 weeks ago - VL < 20 and CD4 > 400.  No changes to insurance coverage. Working on appeal for PA to continue [place] with pharmacy now No dental needs today.  No concern over anxious/depressed mood.  Sexual health and family planning discussed - no needs - pap smear collected recently and normal at local PCP. Will request for results. Previous DHHS guidelines recommend lifelong screening for women, will look to any updates  Vaccines updated today - see health maintenance section.  Statin discussed and CVD risk < 5% - decision made to defer and continue nutrition changes to lower TG's.   Return in about 9 months (around 09/18/2023).  Will re-screen lipids and check diabetes screening as well with A1C.        Relevant Medications   Darunavir-Cobicistat-Emtricitabine-Tenofovir Alafenamide (SYMTUZA) 800-150-200-10 MG TABS   Other Relevant Orders   HIV 1 RNA quant-no reflex-bld   T-helper cells (CD4) count   Other Visit Diagnoses     Diabetes mellitus screening       Relevant Orders   Hemoglobin A1c      Repeat HBsAb next OV - may benefit from booster dose from newer vaccines   Rexene Alberts, MSN, NP-C [facility] Pager: [phone] Office: [phone]  12/19/22  9:50 AM

## 2022-12-19 NOTE — Assessment & Plan Note (Signed)
With new research coming out by way of the REPRIEVE study regarding cardiovascular event risk reduction for PLWH, I discussed that over the 8 year trial initiation of statin (pitavastatin) therapy was shown to reduce CV disease by 35% independent of other risk factors.    The 10-year ASCVD risk score (Arnett DK, et al., 2019) is: 4.7%   Values used to calculate the score:     Age: 62 years     Sex: Female     Is Non-Hispanic African American: No     Diabetic: No     Tobacco smoker: No     Systolic Blood Pressure: 110 mmHg     Is BP treated: Yes     HDL Cholesterol: 56 mg/dL     Total Cholesterol: 184 mg/dL   We discussed the patient's 10-year CVD Risk Score to be at least moderately elevated, and would benefit from intervention given HIV+ and > 16 yo.   With overall risk <5% and no family history of CAD will work with nutrition changes alone for treatment.

## 2022-12-19 NOTE — Patient Instructions (Addendum)
I agree with the changes you mentioned about diet to help your triglycerides go down   Return in about 9 months (around 09/18/2023).

## 2022-12-19 NOTE — Assessment & Plan Note (Addendum)
Very well controlled on once daily Symtuza. No concerns with access or adherence to medication. They are tolerating the medication well without side effects. No drug interactions identified. Pertinent lab tests reviewed from 2 weeks ago - VL < 20 and CD4 > 400.  No changes to insurance coverage. Working on appeal for PA to continue [place] with pharmacy now No dental needs today.  No concern over anxious/depressed mood.  Sexual health and family planning discussed - no needs - pap smear collected recently and normal at local PCP. Will request for results. Previous DHHS guidelines recommend lifelong screening for women, will look to any updates  Vaccines updated today - see health maintenance section.  Statin discussed and CVD risk < 5% - decision made to defer and continue nutrition changes to lower TG's.   Return in about 9 months (around 09/18/2023).  Will re-screen lipids and check diabetes screening as well with A1C.

## 2022-12-20 ENCOUNTER — Other Ambulatory Visit (HOSPITAL_COMMUNITY): Payer: Self-pay

## 2022-12-20 NOTE — Telephone Encounter (Signed)
Yay!

## 2022-12-20 NOTE — Telephone Encounter (Signed)
Prior authorization is approved as of  12/15/22 . Expires 12/15/23.

## 2023-01-09 ENCOUNTER — Other Ambulatory Visit (HOSPITAL_COMMUNITY): Payer: Self-pay

## 2023-01-09 ENCOUNTER — Encounter: Payer: Self-pay | Admitting: Infectious Diseases

## 2023-01-13 ENCOUNTER — Other Ambulatory Visit: Payer: Self-pay

## 2023-01-16 ENCOUNTER — Other Ambulatory Visit: Payer: Self-pay

## 2023-01-24 ENCOUNTER — Other Ambulatory Visit (HOSPITAL_COMMUNITY): Payer: Self-pay

## 2023-01-24 NOTE — Progress Notes (Signed)
Specialty Pharmacy Refill Coordination Note  Tammy Henson is a 62 y.o. female contacted today regarding refills of specialty medication(s) Darun-Cobic-Emtricit-Tenofaf   Patient requested Delivery   Delivery date: 01/27/23   Verified address: Patient address [address]  [place] [address]   Medication will be filled on 01/25/23.

## 2023-01-25 ENCOUNTER — Other Ambulatory Visit: Payer: Self-pay

## 2023-02-20 ENCOUNTER — Other Ambulatory Visit: Payer: Self-pay

## 2023-02-20 NOTE — Progress Notes (Signed)
Specialty Pharmacy Refill Coordination Note  Tammy Henson is a 62 y.o. female contacted today regarding refills of specialty medication(s) Darun-Cobic-Emtricit-TenofAF [facility])   Patient requested Delivery   Delivery date: 02/23/23   Verified address: Patient address [address]  [place] [address]   Medication will be filled on 02/21/23.

## 2023-02-21 ENCOUNTER — Other Ambulatory Visit: Payer: Self-pay

## 2023-03-15 ENCOUNTER — Other Ambulatory Visit: Payer: Self-pay

## 2023-03-16 ENCOUNTER — Encounter: Payer: 59 | Admitting: Gastroenterology

## 2023-03-20 ENCOUNTER — Other Ambulatory Visit: Payer: Self-pay

## 2023-03-20 NOTE — Progress Notes (Signed)
Specialty Pharmacy Refill Coordination Note  Tammy Henson is a 63 y.o. female contacted today regarding refills of specialty medication(s) Darun-Cobic-Emtricit-TenofAF [facility])   Patient requested Delivery   Delivery date: 03/23/23   Verified address: Patient address [address]  [place] [address]   Medication will be filled on 01.22.25.

## 2023-04-13 ENCOUNTER — Other Ambulatory Visit (HOSPITAL_COMMUNITY): Payer: Self-pay

## 2023-04-19 ENCOUNTER — Other Ambulatory Visit: Payer: Self-pay

## 2023-04-19 NOTE — Progress Notes (Signed)
 Specialty Pharmacy Refill Coordination Note  Tammy Henson is a 63 y.o. female contacted today regarding refills of specialty medication(s) Darun-Cobic-Emtricit-TenofAF [facility])   Patient requested Delivery   Delivery date: 04/24/23   Verified address: Patient address [address]  [place] [address]   Medication will be filled on 02.21.25.

## 2023-05-19 ENCOUNTER — Other Ambulatory Visit: Payer: Self-pay

## 2023-05-19 NOTE — Progress Notes (Signed)
 Specialty Pharmacy Refill Coordination Note  Tammy Henson is a 63 y.o. female contacted today regarding refills of specialty medication(s) Darun-Cobic-Emtricit-TenofAF [facility])   Patient requested Delivery   Delivery date: 05/23/23   Verified address: Patient address [address]  [place] [address]   Medication will be filled on 03.25.25.

## 2023-05-22 ENCOUNTER — Other Ambulatory Visit (HOSPITAL_COMMUNITY): Payer: Self-pay

## 2023-06-14 ENCOUNTER — Other Ambulatory Visit: Payer: Self-pay | Admitting: Pharmacist

## 2023-06-14 NOTE — Progress Notes (Signed)
 Specialty Pharmacy Ongoing Clinical Assessment Note  Tammy Henson is a 63 y.o. female who is being followed by the specialty pharmacy service for RxSp HIV   Patient's specialty medication(s) reviewed today: Darun-Cobic-Emtricit-TenofAF (SYMTUZA)   Missed doses in the last 4 weeks: 0   Patient/Caregiver did not have any additional questions or concerns.   Therapeutic benefit summary: Patient is achieving benefit   Adverse events/side effects summary: No adverse events/side effects   Patient's therapy is appropriate to: Continue    Goals Addressed   None     Follow up:  6 months  Sonya Duster Specialty Pharmacist

## 2023-06-16 ENCOUNTER — Other Ambulatory Visit: Payer: Self-pay

## 2023-06-19 ENCOUNTER — Other Ambulatory Visit: Payer: Self-pay

## 2023-06-21 ENCOUNTER — Other Ambulatory Visit: Payer: Self-pay

## 2023-06-21 NOTE — Progress Notes (Signed)
 Specialty Pharmacy Refill Coordination Note  Tammy Henson is a 63 y.o. female contacted today regarding refills of specialty medication(s) Darun-Cobic-Emtricit-TenofAF (SYMTUZA )   Patient requested Delivery   Delivery date: 06/23/23   Verified address: Patient address [address]  [place] [address]   Medication will be filled on 04.24.25.

## 2023-06-22 ENCOUNTER — Other Ambulatory Visit: Payer: Self-pay

## 2023-07-14 ENCOUNTER — Other Ambulatory Visit: Payer: Self-pay | Admitting: Pharmacy Technician

## 2023-07-14 ENCOUNTER — Other Ambulatory Visit: Payer: Self-pay

## 2023-07-14 NOTE — Progress Notes (Signed)
 Specialty Pharmacy Refill Coordination Note  Tammy Henson is a 63 y.o. female contacted today regarding refills of specialty medication(s) No data recorded  Patient requested (Patient-Rptd) Delivery   Delivery date: (Patient-Rptd) 07/20/23   Verified address: (Patient-Rptd) [address], [place], [place]  [address], [place]  [address]   Medication will be filled on 07/19/23.

## 2023-07-19 ENCOUNTER — Other Ambulatory Visit (HOSPITAL_COMMUNITY): Payer: Self-pay

## 2023-08-14 ENCOUNTER — Other Ambulatory Visit (HOSPITAL_COMMUNITY): Payer: Self-pay

## 2023-08-14 ENCOUNTER — Other Ambulatory Visit: Payer: Self-pay

## 2023-08-15 ENCOUNTER — Other Ambulatory Visit: Payer: Self-pay

## 2023-08-16 ENCOUNTER — Other Ambulatory Visit (HOSPITAL_COMMUNITY): Payer: Self-pay

## 2023-08-16 ENCOUNTER — Other Ambulatory Visit: Payer: Self-pay

## 2023-08-16 NOTE — Progress Notes (Signed)
 Specialty Pharmacy Refill Coordination Note  Tammy Henson is a 63 y.o. female contacted today regarding refills of specialty medication(s) Darun-Cobic-Emtricit-TenofAF (SYMTUZA )   Patient requested Delivery   Delivery date: 08/18/23   Verified address: [address] [place], [place]  [address]  [address]   Medication will be filled on 08/17/23.

## 2023-09-04 ENCOUNTER — Other Ambulatory Visit: Payer: 59

## 2023-09-08 ENCOUNTER — Other Ambulatory Visit (HOSPITAL_COMMUNITY): Payer: Self-pay

## 2023-09-12 ENCOUNTER — Other Ambulatory Visit: Payer: Self-pay

## 2023-09-12 ENCOUNTER — Other Ambulatory Visit: Payer: Self-pay | Admitting: Pharmacy Technician

## 2023-09-12 NOTE — Progress Notes (Signed)
 Specialty Pharmacy Refill Coordination Note  Tammy Henson is a 63 y.o. female contacted today regarding refills of specialty medication(s) Darun-Cobic-Emtricit-TenofAF (SYMTUZA )   Patient requested Delivery   Delivery date: 09/14/23   Verified address: [address] [place]   Medication will be filled on 09/13/23.

## 2023-09-18 ENCOUNTER — Ambulatory Visit: Payer: 59 | Admitting: Infectious Diseases

## 2023-09-20 ENCOUNTER — Telehealth: Payer: Self-pay

## 2023-09-20 NOTE — Telephone Encounter (Signed)
 Patient called to ask if she is able to take Celebrex with her Symtuza . Spoke with [facility] Pharmacist who did not Celebrex can increase changes of renal impairment if taken with Symtuza . Patient is not sure how long she is supposed to take celebrex. As of right now she has a 30 day supply.  Per pharmacist patient will need to contact provider if she plans on taking this long term to discuss medication changes. Also to refrain from using nsaids (ibuprofen/ naproxen) while on Celebrex, as this can increase risk for renal issues.  Patient verbalized understanding.  Lorenda CHRISTELLA Code, RMA

## 2023-10-03 ENCOUNTER — Other Ambulatory Visit: Payer: Self-pay

## 2023-10-06 ENCOUNTER — Other Ambulatory Visit: Payer: Self-pay

## 2023-10-09 ENCOUNTER — Other Ambulatory Visit: Payer: Self-pay

## 2023-10-09 ENCOUNTER — Other Ambulatory Visit

## 2023-10-09 DIAGNOSIS — B20 Human immunodeficiency virus [HIV] disease: Secondary | ICD-10-CM

## 2023-10-09 DIAGNOSIS — Z131 Encounter for screening for diabetes mellitus: Secondary | ICD-10-CM

## 2023-10-09 DIAGNOSIS — E781 Pure hyperglyceridemia: Secondary | ICD-10-CM

## 2023-10-10 LAB — T-HELPER CELLS (CD4) COUNT (NOT AT ARMC)
CD4 % Helper T Cell: 17 % — ABNORMAL LOW (ref 33–65)
CD4 T Cell Abs: 378 /uL — ABNORMAL LOW (ref 400–1790)

## 2023-10-11 LAB — LIPID PANEL
Cholesterol: 152 mg/dL (ref <200)
HDL: 62 mg/dL (ref >=50)
LDL Cholesterol (Calc): 60 mg/dL
Non-HDL Cholesterol (Calc): 90 mg/dL (ref <130)
Total CHOL/HDL Ratio: 2.5 (calc) (ref <5.0)
Triglycerides: 253 mg/dL — ABNORMAL HIGH (ref <150)

## 2023-10-11 LAB — HEMOGLOBIN A1C
Hgb A1c MFr Bld: 6.3 % — ABNORMAL HIGH (ref <5.7)
Mean Plasma Glucose: 134 mg/dL
eAG (mmol/L): 7.4 mmol/L

## 2023-10-11 LAB — HIV-1 RNA QUANT-NO REFLEX-BLD
HIV 1 RNA Quant: NOT DETECTED {copies}/mL
HIV-1 RNA Quant, Log: NOT DETECTED {Log_copies}/mL

## 2023-10-11 LAB — HEPATITIS B SURFACE ANTIBODY,QUALITATIVE: Hep B S Ab: NONREACTIVE

## 2023-10-16 ENCOUNTER — Ambulatory Visit: Payer: Self-pay | Admitting: Infectious Diseases

## 2023-10-23 ENCOUNTER — Ambulatory Visit: Admitting: Infectious Diseases

## 2023-10-24 ENCOUNTER — Other Ambulatory Visit: Payer: Self-pay

## 2023-10-24 ENCOUNTER — Other Ambulatory Visit (HOSPITAL_COMMUNITY): Payer: Self-pay

## 2023-10-24 ENCOUNTER — Ambulatory Visit (INDEPENDENT_AMBULATORY_CARE_PROVIDER_SITE_OTHER): Admitting: Infectious Diseases

## 2023-10-24 DIAGNOSIS — R7303 Prediabetes: Secondary | ICD-10-CM

## 2023-10-24 DIAGNOSIS — B20 Human immunodeficiency virus [HIV] disease: Secondary | ICD-10-CM | POA: Diagnosis not present

## 2023-10-24 MED ORDER — DARUN-COBIC-EMTRICIT-TENOFAF 800-150-200-10 MG PO TABS
1.0000 | ORAL_TABLET | Freq: Every day | ORAL | 11 refills | Status: AC
  Filled 2023-10-24 – 2023-11-20 (×2): qty 30, 30d supply, fill #0
  Filled 2023-12-20 – 2023-12-27 (×5): qty 30, 30d supply, fill #1
  Filled 2024-01-18 – 2024-01-19 (×2): qty 30, 30d supply, fill #2
  Filled 2024-02-16: qty 30, 30d supply, fill #3
  Filled 2024-03-14: qty 30, 30d supply, fill #4

## 2023-10-24 NOTE — Progress Notes (Signed)
 Name: Tammy Henson  DOB: 30-Sep-1960 MRN: [id number] PCP: Melonie Colonel, Mikel HERO, MD    Brief Narrative:  Tammy Henson is a 63 y.o. female with HIV diagnosed ~1995 CD4 nadir unknown VL unknown HIV Risk: sexual History of OIs: unknown Intake Labs : Hep B sAg (- 2008), sAb (-2008), cAb (); Hep A (), Hep C () Quantiferon () HLA B*5701 () G6PD: ()   Previous Regimens: Symtuza    Genotypes:   Subjective:   Chief Complaint  Patient presents with   Follow-up    No concerns      Discussed the use of AI scribe software for clinical note transcription with the patient, who gave verbal consent to proceed.  History of Present Illness   Tammy Henson is a 63 year old female with HIV who presents for routine follow-up care.  She continues to take Symtuza  once daily. Recent laboratory results show an undetectable viral load, a CD4 count of 378, and a stable lipid panel with mild elevation of triglycerides at 253 mg/dL. Her HDL is 62 mg/dL, and her total cholesterol is 152 mg/dL. Her A1c is 6.3%.  She experiences knee pain, for which she visited an orthopedic doctor earlier today and received a steroid injection. The knee is not fully recovered but she feels significant relief after the injection. She has had two X-rays, which did not show any abnormalities. The pain began suddenly one night with swelling and pain in the knee and the muscle behind it.  She has resumed taking vitamins and hormonal supplements, including a multivitamin called 'Her Journey' and a supplement called 'Transitions' for menopause relief, which she takes once daily. She reports some improvement in her symptoms, including hot flashes, since starting these supplements less than a month ago.  She works eight and a half to nine hours a day and feels very tired. She wants to reduce her work hours to have more time for other activities. She tries to incorporate more physical activity into her  routine, such as walking during breaks at work, but finds it challenging due to fatigue.  Her social history includes a stressful job and a desire to reduce work hours. She has a dog but does not walk it regularly. Her late husband had hepatitis B and HIV, and passed away 24 years ago. She has three children who had chickenpox simultaneously, but she does not recall having it herself.          10/24/2023    3:40 PM  Depression screen PHQ 2/9  Decreased Interest 0  Down, Depressed, Hopeless 0  PHQ - 2 Score 0    Review of Systems  Constitutional:  Negative for chills and fever.  HENT:  Negative for tinnitus.   Eyes:  Negative for blurred vision and photophobia.  Respiratory:  Negative for cough and sputum production.   Cardiovascular:  Negative for chest pain.  Gastrointestinal:  Negative for diarrhea, nausea and vomiting.  Genitourinary:  Negative for dysuria.  Skin:  Negative for rash.  Neurological:  Negative for headaches.     Past Medical History:  Diagnosis Date   Allergic rhinitis 12/31/2005   Qualifier: Diagnosis of  By: Lutricia RN, Denise     Human immunodeficiency virus (HIV) disease (HCC) 12/31/2005   Qualifier: Diagnosis of  By: Lutricia RN, Karna     HYPERLIPIDEMIA 12/31/2005   Qualifier: Diagnosis of  By: Lutricia OBIE Karna     HYPERTENSION 12/31/2005   Qualifier: Diagnosis of  By:  Lutricia RN, Karna     Vitamin D deficiency 2010    Outpatient Medications Prior to Visit  Medication Sig Dispense Refill   cetirizine (ZYRTEC) 10 MG tablet Take 10 mg by mouth daily.     Cholecalciferol (VITAMIN D) 50 MCG (2000 UT) tablet Take 2,000 Units by mouth daily.     lisinopril  (ZESTRIL ) 20 MG tablet TAKE 1 TABLET BY MOUTH EVERY DAY 90 tablet 0   Multiple Vitamins-Minerals (MULTIVITAMIN WITH MINERALS) tablet Take 1 tablet by mouth daily.     sodium chloride (OCEAN) 0.65 % SOLN nasal spray Place 1 spray into both nostrils as needed for congestion.     Specialty Vitamins  Products (MENOPAUSE SUPPORT PO) Take 1 tablet by mouth daily.     Darunavir -Cobicistat -Emtricitabine -Tenofovir  Alafenamide (SYMTUZA ) 800-150-200-10 MG TABS Take 1 tablet by mouth daily with breakfast. 30 tablet 11   beclomethasone (BECONASE -AQ) 42 MCG/SPRAY nasal spray Place 2 sprays into each nostril 2 (two) times daily. (Patient not taking: Reported on 10/24/2023) 25 g 12   Beta Carotene (VITAMIN A) 25000 UNIT capsule Take 25,000 Units by mouth daily. (Patient not taking: Reported on 10/24/2023)     Omega-3 Fatty Acids  (FISH OIL PO) Take by mouth daily. (Patient not taking: Reported on 10/24/2023)     No facility-administered medications prior to visit.     No Known Allergies  Social History   Tobacco Use   Smoking status: Former    Passive exposure: Never   Smokeless tobacco: Never   Tobacco comments:    smoked for 5 years, quit age [age]  Vaping Use   Vaping status: Never Used  Substance Use Topics   Alcohol use: No    Alcohol/week: 0.0 standard drinks of alcohol   Drug use: No    Family History  Problem Relation Age of Onset   Hypertension Mother    Depression Mother    Heart disease Mother        CHF   Heart disease Father        first MI in his 87's   HIV Daughter    Epilepsy Brother    HIV Brother    Hypertension Brother    Hypercholesterolemia Brother    Diabetes Neg Hx    Cancer Neg Hx    Breast cancer Neg Hx     Social History   Substance and Sexual Activity  Sexual Activity Not Currently   Partners: Male     Objective:   Vitals:   10/24/23 1532  BP: 125/82  Pulse: (!) 101  Temp: 98.7 F (37.1 C)  TempSrc: Oral  SpO2: 95%  Weight: 144 lb 3.2 oz (65.4 kg)  Height: 5' 2.5 (1.588 m)   Body mass index is 25.95 kg/m.  Physical Exam Vitals reviewed.  Constitutional:      Appearance: She is well-developed.  HENT:     Mouth/Throat:     Mouth: No oral lesions.     Dentition: Normal dentition. No dental abscesses.     Pharynx: No  oropharyngeal exudate.  Cardiovascular:     Rate and Rhythm: Normal rate and regular rhythm.     Heart sounds: Normal heart sounds.  Pulmonary:     Effort: Pulmonary effort is normal.     Breath sounds: Normal breath sounds.  Abdominal:     General: There is no distension.     Palpations: Abdomen is soft.     Tenderness: There is no abdominal tenderness.  Lymphadenopathy:     Cervical: No  cervical adenopathy.  Skin:    General: Skin is warm and dry.     Findings: No rash.  Neurological:     Mental Status: She is alert and oriented to person, place, and time.  Psychiatric:        Judgment: Judgment normal.     Lab Results Lab Results  Component Value Date   WBC 5.1 12/09/2022   HGB 13.2 12/09/2022   HCT 39.9 12/09/2022   MCV 93.0 12/09/2022   PLT 311 12/09/2022    Lab Results  Component Value Date   CREATININE 0.76 12/09/2022   BUN 14 12/09/2022   NA 137 12/09/2022   K 4.3 12/09/2022   CL 103 12/09/2022   CO2 25 12/09/2022    Lab Results  Component Value Date   ALT 11 12/09/2022   AST 11 12/09/2022   ALKPHOS 75 06/06/2016   BILITOT 0.3 12/09/2022    Lab Results  Component Value Date   CHOL 152 10/09/2023   HDL 62 10/09/2023   LDLCALC 60 10/09/2023   TRIG 253 (H) 10/09/2023   CHOLHDL 2.5 10/09/2023   HIV 1 RNA Quant  Date Value  10/09/2023 NOT DETECTED copies/mL  12/09/2022 Not Detected Copies/mL  12/13/2021 Not Detected Copies/mL   CD4 T Cell Abs (/uL)  Date Value  10/09/2023 378 (L)  12/13/2021 310 (L)  12/03/2019 311 (L)     Assessment & Plan:   Assessment and Plan    Human immunodeficiency virus (HIV) infection - HIV infection is well-controlled with an undetectable viral load and a CD4 count of 378. Pap smear has always been normal w/o any changes. No new sexual partners. She would prefer to defer lifelong screenings going forward, which I think is reasonable. Mamogram due next year. DEXA scan discussed - she will talk with her PCP at  routine follow up.  Shingrix, Heplisav and Flu shot discussed - she will consider shingrix and heplisav.  - Continue Symtuza  once daily - Arrange medication refills - FU in 1 year for routine check in   Prediabetes - A1c indicates prediabetes at 6.3%. She is aware of her prediabetic status and is attempting to reduce sweets. - Incorporate physical activity, such as a 10-minute walk after meals, to help manage blood sugar levels  Vaccinations and screenings - Discussed potential vaccinations and screenings, including hepatitis B booster, shingles vaccine, and bone density scan. Hepatitis B booster with newer vaccine may offer better protection. Shingles vaccine has immune activating side effects, and bone density scan is recommended due to increased risk of osteoporosis. - Consider hepatitis B booster with newer vaccine - Consider shingles vaccine - Discuss bone density scan with primary care provider in February  Visit duration: 25 minutes      Meds ordered this encounter  Medications   Darunavir -Cobicistat -Emtricitabine -Tenofovir  Alafenamide (SYMTUZA ) 800-150-200-10 MG TABS    Sig: Take 1 tablet by mouth daily with breakfast.    Dispense:  30 tablet    Refill:  11   Orders Placed This Encounter  Procedures   HIV 1 RNA quant-no reflex-bld    Standing Status:   Future    Expected Date:   10/23/2024    Expiration Date:   01/21/2025   T-helper cells (CD4) count    Standing Status:   Future    Expected Date:   10/23/2024    Expiration Date:   01/21/2025   COMPLETE METABOLIC PANEL WITHOUT GFR    Standing Status:   Future    Expected Date:  10/23/2024    Expiration Date:   01/21/2025   CBC w/Diff    Standing Status:   Future    Expected Date:   10/23/2024    Expiration Date:   01/21/2025   Lipid panel    Standing Status:   Future    Expected Date:   10/23/2024    Expiration Date:   01/21/2025     Corean Fireman, MSN, NP-C [facility] Pager: [phone] Office: [phone]  10/24/23  4:31 PM

## 2023-10-24 NOTE — Patient Instructions (Addendum)
 Please continue the Symtuza  everyday   Ask Dr. Melonie about a bone density scan when you see her in February   Will have you back next August like usual. We can do labs same day or ahead of time like you are used to. Schedule to your preference please    Would recommend the Shingrix vaccine and the newer hepatitis B vaccine called heplisav

## 2023-10-24 NOTE — Progress Notes (Signed)
 Specialty Pharmacy Refill Coordination Note  Tammy Henson is a 63 y.o. female contacted today regarding refills of specialty medication(s) Darun-Cobic-Emtricit-TenofAF (SYMTUZA )   Patient requested Delivery   Delivery date: 10/25/23   Verified address: [address] [place]   Medication will be filled on 10/24/23.

## 2023-10-25 ENCOUNTER — Other Ambulatory Visit: Payer: Self-pay

## 2023-11-20 ENCOUNTER — Other Ambulatory Visit: Payer: Self-pay

## 2023-11-22 ENCOUNTER — Other Ambulatory Visit: Payer: Self-pay

## 2023-11-22 ENCOUNTER — Other Ambulatory Visit (HOSPITAL_COMMUNITY): Payer: Self-pay

## 2023-11-22 NOTE — Progress Notes (Signed)
 Specialty Pharmacy Refill Coordination Note  Spoke with Vina Byrd is a 63 y.o. female contacted today regarding refills of specialty medication(s) Darun-Cobic-Emtricit-TenofAF (SYMTUZA )  Doses on hand: 5  Patient requested: Delivery   Delivery date: 11/24/23   Verified address: [address] [place] [address]  Medication will be filled on 11/23/23.

## 2023-11-28 ENCOUNTER — Other Ambulatory Visit: Payer: Self-pay | Admitting: Family Medicine

## 2023-11-28 DIAGNOSIS — Z1231 Encounter for screening mammogram for malignant neoplasm of breast: Secondary | ICD-10-CM

## 2023-12-05 ENCOUNTER — Other Ambulatory Visit: Payer: Self-pay

## 2023-12-06 ENCOUNTER — Inpatient Hospital Stay: Admission: RE | Admit: 2023-12-06 | Payer: 59 | Source: Ambulatory Visit

## 2023-12-15 ENCOUNTER — Other Ambulatory Visit: Payer: Self-pay

## 2023-12-20 ENCOUNTER — Other Ambulatory Visit (HOSPITAL_COMMUNITY): Payer: Self-pay

## 2023-12-20 ENCOUNTER — Other Ambulatory Visit: Payer: Self-pay

## 2023-12-20 NOTE — Progress Notes (Signed)
 Benefits Investigation Started  Rejection: Product/Service Not Covered - Plan/Benefit Exclusion (Use Alt or PA REQ'D Call [phone]. Alt Opt: [employer]ent[employer] + Cimduo or Tivicay+ Cimduo or One of: Dovato, Juluca, Symfi, Symfi Lo,Triumeq)  Routed to: Donna/Courtney

## 2023-12-21 ENCOUNTER — Other Ambulatory Visit (HOSPITAL_COMMUNITY): Payer: Self-pay

## 2023-12-21 ENCOUNTER — Other Ambulatory Visit: Payer: Self-pay

## 2023-12-21 ENCOUNTER — Telehealth: Payer: Self-pay

## 2023-12-21 NOTE — Telephone Encounter (Signed)
 Submitted a Prior Authorization request to [facility] for Symtuza  via [employer]. Will update once we receive a response.  J Code: CPT:  PA ID: [employer]

## 2023-12-22 ENCOUNTER — Other Ambulatory Visit (HOSPITAL_COMMUNITY): Payer: Self-pay

## 2023-12-25 ENCOUNTER — Other Ambulatory Visit: Payer: Self-pay

## 2023-12-25 NOTE — Telephone Encounter (Signed)
 Patient called stating that she has 3 pills left of Symtuza . Informed her that pharmacy has initiated a PA and waiting to hear back. Do we have any samples of Symtuza ?  Behr Cislo SHAUNNA Letters, CMA

## 2023-12-26 ENCOUNTER — Other Ambulatory Visit: Payer: Self-pay

## 2023-12-26 ENCOUNTER — Telehealth: Payer: Self-pay | Admitting: Pharmacist

## 2023-12-26 NOTE — Telephone Encounter (Signed)
 [employer] denied prior authorization for Symtuza . Submitted appeal via fax today. Will await response.  Alan Geralds, PharmD, CPP, BCIDP, AAHIVP Clinical Pharmacist Practitioner Infectious Diseases Clinical Pharmacist [facility]

## 2023-12-27 ENCOUNTER — Telehealth: Payer: Self-pay

## 2023-12-27 ENCOUNTER — Other Ambulatory Visit: Payer: Self-pay

## 2023-12-27 ENCOUNTER — Other Ambulatory Visit (HOSPITAL_COMMUNITY): Payer: Self-pay

## 2023-12-27 NOTE — Telephone Encounter (Signed)
 Received notification from [facility] regarding a prior authorization for Symtuza . Authorization has been APPROVED from 12/27/23 to 12/22/24.   Per test claim, copay for 30 days supply is $0.00  Patient can continue to fill through [facility]: [phone]

## 2023-12-27 NOTE — Progress Notes (Signed)
 Specialty Pharmacy Refill Coordination Note  Tammy Henson is a 63 y.o. female contacted today regarding refills of specialty medication(s) Darun-Cobic-Emtricit-TenofAF (SYMTUZA )   Patient requested Delivery   Delivery date: 12/28/23   Verified address: [address] [place] [address]   Medication will be filled on: 12/27/23

## 2023-12-27 NOTE — Telephone Encounter (Signed)
Yay!! Thank you!

## 2023-12-28 ENCOUNTER — Other Ambulatory Visit: Payer: Self-pay | Admitting: Pharmacist

## 2023-12-28 DIAGNOSIS — B20 Human immunodeficiency virus [HIV] disease: Secondary | ICD-10-CM

## 2023-12-28 MED ORDER — SYMTUZA 800-150-200-10 MG PO TABS: 1.0000 | ORAL_TABLET | Freq: Every day | ORAL | Status: AC

## 2023-12-28 NOTE — Progress Notes (Signed)
 Medication Samples have been provided to the patient.  Drug name: Symtuza         Strength: 800/150/200/10 mg Qty: 30  Tablets (1 bottles) LOT: [id number]   Exp.Date: 7/26  Samples requested by Alan Geralds, PharmD.  Dosing instructions: Take one tablet by mouth once daily with food  The patient has been instructed regarding the correct time, dose, and frequency of taking this medication, including desired effects and most common side effects.   Alan Geralds, PharmD, CPP, BCIDP, AAHIVP Clinical Pharmacist Practitioner Infectious Diseases Clinical Pharmacist [facility] Infect[facility]

## 2024-01-18 ENCOUNTER — Other Ambulatory Visit (HOSPITAL_COMMUNITY): Payer: Self-pay

## 2024-01-19 ENCOUNTER — Other Ambulatory Visit (HOSPITAL_COMMUNITY): Payer: Self-pay

## 2024-01-23 ENCOUNTER — Other Ambulatory Visit: Payer: Self-pay

## 2024-01-23 NOTE — Progress Notes (Signed)
 Specialty Pharmacy Refill Coordination Note  Tammy Henson is a 63 y.o. female contacted today regarding refills of specialty medication(s) Darun-Cobic-Emtricit-TenofAF (Symtuza )   Patient requested Delivery   Delivery date: 01/26/24   Verified address: [address] [place] [address]   Medication will be filled on: 01/24/24

## 2024-02-16 ENCOUNTER — Other Ambulatory Visit: Payer: Self-pay

## 2024-02-19 ENCOUNTER — Other Ambulatory Visit: Payer: Self-pay

## 2024-02-19 NOTE — Progress Notes (Signed)
 Specialty Pharmacy Refill Coordination Note  Tammy Henson is a 63 y.o. female contacted today regarding refills of specialty medication(s) Darun-Cobic-Emtricit-TenofAF (Symtuza )   Patient requested Delivery   Delivery date: 02/21/24   Verified address: [address] [place] [address]   Medication will be filled on: 02/20/24

## 2024-03-08 NOTE — Progress Notes (Signed)
 The 10-year ASCVD risk score (Arnett DK, et al., 2019) is: 4.6%   Values used to calculate the score:     Age: 64 years     Clinically relevant sex: Female     Is Non-Hispanic African American: No     Diabetic: No     Tobacco smoker: No     Systolic Blood Pressure: 110 mmHg     Is BP treated: Yes     HDL Cholesterol: 62 mg/dL     Total Cholesterol: 152 mg/dL  Duwaine Lowe, BSN, RN

## 2024-03-14 ENCOUNTER — Other Ambulatory Visit (HOSPITAL_COMMUNITY): Payer: Self-pay

## 2024-03-15 ENCOUNTER — Other Ambulatory Visit: Payer: Self-pay

## 2024-03-15 ENCOUNTER — Other Ambulatory Visit: Payer: Self-pay | Admitting: Pharmacist

## 2024-03-15 NOTE — Progress Notes (Signed)
 Specialty Pharmacy Ongoing Clinical Assessment Note  Tammy Henson is a 64 y.o. female who is being followed by the specialty pharmacy service for RxSp HIV   Patient's specialty medication(s) reviewed today: Darun-Cobic-Emtricit-TenofAF (Symtuza )   Missed doses in the last 4 weeks: 0   Patient/Caregiver did not have any additional questions or concerns.   Therapeutic benefit summary: Patient is achieving benefit   Adverse events/side effects summary: No adverse events/side effects   Patient's therapy is appropriate to: Continue    Goals Addressed             This Visit's Progress    Achieve Undetectable HIV Viral Load < 20   On track    Patient is on track. Patient will maintain adherence.  Last HIV RNA from 10/09/2023 remained undetectable.         Follow up: 12 months  Lyle LELON Chalk Specialty Pharmacist

## 2024-03-15 NOTE — Progress Notes (Signed)
 Specialty Pharmacy Refill Coordination Note  Tammy Henson is a 64 y.o. female contacted today regarding refills of specialty medication(s) Darun-Cobic-Emtricit-TenofAF (Symtuza )   Patient requested Delivery   Delivery date: 03/18/24   Verified address: [address] [place] [address]   Medication will be filled on: 03/15/24
